# Patient Record
Sex: Female | Born: 1965 | Race: White | Hispanic: No | Marital: Married | Smoking: Former smoker
Health system: Southern US, Community
[De-identification: ages and names within clinical notes are randomized; demographics above are authoritative.]

## PROBLEM LIST (undated history)

## (undated) DIAGNOSIS — G500 Trigeminal neuralgia: Secondary | ICD-10-CM

## (undated) DIAGNOSIS — I471 Supraventricular tachycardia, unspecified: Secondary | ICD-10-CM

## (undated) DIAGNOSIS — G2581 Restless legs syndrome: Secondary | ICD-10-CM

## (undated) DIAGNOSIS — G44099 Other trigeminal autonomic cephalgias (TAC), not intractable: Secondary | ICD-10-CM

## (undated) DIAGNOSIS — G4733 Obstructive sleep apnea (adult) (pediatric): Secondary | ICD-10-CM

## (undated) HISTORY — DX: Other trigeminal autonomic cephalgias (tac), not intractable: G44.099

## (undated) HISTORY — DX: Trigeminal neuralgia: G50.0

## (undated) HISTORY — DX: Obstructive sleep apnea (adult) (pediatric): G47.33

## (undated) HISTORY — DX: Supraventricular tachycardia, unspecified: I47.10

## (undated) HISTORY — DX: Supraventricular tachycardia: I47.1

## (undated) HISTORY — PX: BRONCHOSCOPY: SUR163

## (undated) HISTORY — PX: SALIVARY STONE REMOVAL: SHX5213

## (undated) HISTORY — PX: BACK SURGERY: SHX140

## (undated) HISTORY — DX: Restless legs syndrome: G25.81

## (undated) HISTORY — PX: TOTAL ABDOMINAL HYSTERECTOMY: SHX209

## (undated) HISTORY — PX: COLONOSCOPY: SHX174

## (undated) HISTORY — PX: TONSILLECTOMY: SUR1361

## (undated) HISTORY — PX: OTHER SURGICAL HISTORY: SHX169

---

## 2004-08-27 ENCOUNTER — Encounter: Admission: RE | Admit: 2004-08-27 | Discharge: 2004-08-27 | Payer: Self-pay | Admitting: Vascular Surgery

## 2004-09-05 ENCOUNTER — Encounter (INDEPENDENT_AMBULATORY_CARE_PROVIDER_SITE_OTHER): Payer: Self-pay | Admitting: Specialist

## 2004-09-05 ENCOUNTER — Encounter: Admission: RE | Admit: 2004-09-05 | Discharge: 2004-09-05 | Payer: Self-pay | Admitting: Vascular Surgery

## 2006-12-02 ENCOUNTER — Encounter: Admission: RE | Admit: 2006-12-02 | Discharge: 2006-12-02 | Payer: Self-pay | Admitting: Family Medicine

## 2014-07-14 DIAGNOSIS — M1990 Unspecified osteoarthritis, unspecified site: Secondary | ICD-10-CM

## 2014-07-14 HISTORY — DX: Unspecified osteoarthritis, unspecified site: M19.90

## 2015-04-23 DIAGNOSIS — J209 Acute bronchitis, unspecified: Secondary | ICD-10-CM | POA: Diagnosis not present

## 2015-06-05 DIAGNOSIS — G2581 Restless legs syndrome: Secondary | ICD-10-CM | POA: Diagnosis not present

## 2015-06-05 DIAGNOSIS — R7301 Impaired fasting glucose: Secondary | ICD-10-CM | POA: Diagnosis not present

## 2015-06-05 DIAGNOSIS — E782 Mixed hyperlipidemia: Secondary | ICD-10-CM | POA: Diagnosis not present

## 2015-06-05 DIAGNOSIS — M1990 Unspecified osteoarthritis, unspecified site: Secondary | ICD-10-CM | POA: Diagnosis not present

## 2015-06-05 DIAGNOSIS — I7300 Raynaud's syndrome without gangrene: Secondary | ICD-10-CM | POA: Diagnosis not present

## 2015-06-05 DIAGNOSIS — Z79899 Other long term (current) drug therapy: Secondary | ICD-10-CM | POA: Diagnosis not present

## 2015-06-25 DIAGNOSIS — G2581 Restless legs syndrome: Secondary | ICD-10-CM | POA: Diagnosis not present

## 2015-06-25 DIAGNOSIS — I7300 Raynaud's syndrome without gangrene: Secondary | ICD-10-CM | POA: Diagnosis not present

## 2015-06-25 DIAGNOSIS — E782 Mixed hyperlipidemia: Secondary | ICD-10-CM | POA: Diagnosis not present

## 2015-10-09 DIAGNOSIS — N3941 Urge incontinence: Secondary | ICD-10-CM | POA: Diagnosis not present

## 2015-10-09 DIAGNOSIS — J4540 Moderate persistent asthma, uncomplicated: Secondary | ICD-10-CM | POA: Diagnosis not present

## 2015-10-09 DIAGNOSIS — E782 Mixed hyperlipidemia: Secondary | ICD-10-CM | POA: Diagnosis not present

## 2015-10-09 DIAGNOSIS — I10 Essential (primary) hypertension: Secondary | ICD-10-CM | POA: Diagnosis not present

## 2015-10-09 DIAGNOSIS — K219 Gastro-esophageal reflux disease without esophagitis: Secondary | ICD-10-CM | POA: Diagnosis not present

## 2015-10-09 DIAGNOSIS — M3219 Other organ or system involvement in systemic lupus erythematosus: Secondary | ICD-10-CM | POA: Diagnosis not present

## 2015-10-17 DIAGNOSIS — J449 Chronic obstructive pulmonary disease, unspecified: Secondary | ICD-10-CM | POA: Diagnosis not present

## 2015-10-17 DIAGNOSIS — G4750 Parasomnia, unspecified: Secondary | ICD-10-CM | POA: Diagnosis not present

## 2015-10-17 DIAGNOSIS — G4710 Hypersomnia, unspecified: Secondary | ICD-10-CM | POA: Diagnosis not present

## 2015-10-17 DIAGNOSIS — G2581 Restless legs syndrome: Secondary | ICD-10-CM | POA: Diagnosis not present

## 2015-11-09 DIAGNOSIS — G4720 Circadian rhythm sleep disorder, unspecified type: Secondary | ICD-10-CM | POA: Diagnosis not present

## 2015-11-09 DIAGNOSIS — G4730 Sleep apnea, unspecified: Secondary | ICD-10-CM | POA: Diagnosis not present

## 2015-11-16 DIAGNOSIS — J449 Chronic obstructive pulmonary disease, unspecified: Secondary | ICD-10-CM | POA: Diagnosis not present

## 2015-11-16 DIAGNOSIS — R0683 Snoring: Secondary | ICD-10-CM | POA: Diagnosis not present

## 2015-11-16 DIAGNOSIS — I10 Essential (primary) hypertension: Secondary | ICD-10-CM | POA: Diagnosis not present

## 2015-11-16 DIAGNOSIS — L930 Discoid lupus erythematosus: Secondary | ICD-10-CM | POA: Diagnosis not present

## 2015-11-20 DIAGNOSIS — I10 Essential (primary) hypertension: Secondary | ICD-10-CM | POA: Diagnosis not present

## 2015-11-20 DIAGNOSIS — M545 Low back pain: Secondary | ICD-10-CM | POA: Diagnosis not present

## 2015-11-20 DIAGNOSIS — G2581 Restless legs syndrome: Secondary | ICD-10-CM | POA: Diagnosis not present

## 2015-11-20 DIAGNOSIS — E6609 Other obesity due to excess calories: Secondary | ICD-10-CM | POA: Diagnosis not present

## 2015-11-20 DIAGNOSIS — M5136 Other intervertebral disc degeneration, lumbar region: Secondary | ICD-10-CM | POA: Diagnosis not present

## 2015-11-21 DIAGNOSIS — J4540 Moderate persistent asthma, uncomplicated: Secondary | ICD-10-CM | POA: Diagnosis not present

## 2015-11-21 DIAGNOSIS — K219 Gastro-esophageal reflux disease without esophagitis: Secondary | ICD-10-CM | POA: Diagnosis not present

## 2015-11-21 DIAGNOSIS — I10 Essential (primary) hypertension: Secondary | ICD-10-CM | POA: Diagnosis not present

## 2015-11-21 DIAGNOSIS — M3219 Other organ or system involvement in systemic lupus erythematosus: Secondary | ICD-10-CM | POA: Diagnosis not present

## 2015-11-21 DIAGNOSIS — N3941 Urge incontinence: Secondary | ICD-10-CM | POA: Diagnosis not present

## 2015-12-10 DIAGNOSIS — M25511 Pain in right shoulder: Secondary | ICD-10-CM | POA: Diagnosis not present

## 2015-12-10 DIAGNOSIS — Z79899 Other long term (current) drug therapy: Secondary | ICD-10-CM | POA: Diagnosis not present

## 2015-12-10 DIAGNOSIS — R3129 Other microscopic hematuria: Secondary | ICD-10-CM | POA: Diagnosis not present

## 2015-12-10 DIAGNOSIS — I10 Essential (primary) hypertension: Secondary | ICD-10-CM | POA: Diagnosis not present

## 2015-12-10 DIAGNOSIS — M3219 Other organ or system involvement in systemic lupus erythematosus: Secondary | ICD-10-CM | POA: Diagnosis not present

## 2015-12-11 DIAGNOSIS — M5136 Other intervertebral disc degeneration, lumbar region: Secondary | ICD-10-CM | POA: Diagnosis not present

## 2015-12-11 DIAGNOSIS — Z90710 Acquired absence of both cervix and uterus: Secondary | ICD-10-CM | POA: Diagnosis not present

## 2015-12-11 DIAGNOSIS — N200 Calculus of kidney: Secondary | ICD-10-CM | POA: Diagnosis not present

## 2015-12-14 DIAGNOSIS — N200 Calculus of kidney: Secondary | ICD-10-CM | POA: Diagnosis not present

## 2015-12-14 DIAGNOSIS — R3129 Other microscopic hematuria: Secondary | ICD-10-CM | POA: Diagnosis not present

## 2015-12-14 DIAGNOSIS — R1032 Left lower quadrant pain: Secondary | ICD-10-CM | POA: Diagnosis not present

## 2015-12-17 DIAGNOSIS — N200 Calculus of kidney: Secondary | ICD-10-CM | POA: Diagnosis not present

## 2015-12-19 DIAGNOSIS — R3129 Other microscopic hematuria: Secondary | ICD-10-CM | POA: Diagnosis not present

## 2015-12-19 DIAGNOSIS — R109 Unspecified abdominal pain: Secondary | ICD-10-CM | POA: Diagnosis not present

## 2015-12-19 DIAGNOSIS — N200 Calculus of kidney: Secondary | ICD-10-CM | POA: Diagnosis not present

## 2015-12-21 DIAGNOSIS — M797 Fibromyalgia: Secondary | ICD-10-CM | POA: Diagnosis not present

## 2015-12-21 DIAGNOSIS — N200 Calculus of kidney: Secondary | ICD-10-CM | POA: Diagnosis not present

## 2015-12-21 DIAGNOSIS — N202 Calculus of kidney with calculus of ureter: Secondary | ICD-10-CM | POA: Diagnosis not present

## 2015-12-21 DIAGNOSIS — J449 Chronic obstructive pulmonary disease, unspecified: Secondary | ICD-10-CM | POA: Diagnosis not present

## 2015-12-21 DIAGNOSIS — M329 Systemic lupus erythematosus, unspecified: Secondary | ICD-10-CM | POA: Diagnosis not present

## 2016-01-01 DIAGNOSIS — N200 Calculus of kidney: Secondary | ICD-10-CM | POA: Diagnosis not present

## 2016-01-01 DIAGNOSIS — R109 Unspecified abdominal pain: Secondary | ICD-10-CM | POA: Diagnosis not present

## 2016-01-08 DIAGNOSIS — N200 Calculus of kidney: Secondary | ICD-10-CM | POA: Diagnosis not present

## 2016-01-16 DIAGNOSIS — G4733 Obstructive sleep apnea (adult) (pediatric): Secondary | ICD-10-CM | POA: Diagnosis not present

## 2016-01-16 DIAGNOSIS — I10 Essential (primary) hypertension: Secondary | ICD-10-CM | POA: Diagnosis not present

## 2016-01-16 DIAGNOSIS — M5431 Sciatica, right side: Secondary | ICD-10-CM | POA: Diagnosis not present

## 2016-01-16 DIAGNOSIS — E782 Mixed hyperlipidemia: Secondary | ICD-10-CM | POA: Diagnosis not present

## 2016-01-16 DIAGNOSIS — N3941 Urge incontinence: Secondary | ICD-10-CM | POA: Diagnosis not present

## 2016-01-16 DIAGNOSIS — M545 Low back pain: Secondary | ICD-10-CM | POA: Diagnosis not present

## 2016-01-16 DIAGNOSIS — J4540 Moderate persistent asthma, uncomplicated: Secondary | ICD-10-CM | POA: Diagnosis not present

## 2016-01-16 DIAGNOSIS — M3219 Other organ or system involvement in systemic lupus erythematosus: Secondary | ICD-10-CM | POA: Diagnosis not present

## 2016-02-14 DIAGNOSIS — N200 Calculus of kidney: Secondary | ICD-10-CM | POA: Diagnosis not present

## 2016-02-14 DIAGNOSIS — R109 Unspecified abdominal pain: Secondary | ICD-10-CM | POA: Diagnosis not present

## 2016-03-09 DIAGNOSIS — S8392XA Sprain of unspecified site of left knee, initial encounter: Secondary | ICD-10-CM | POA: Diagnosis not present

## 2016-04-09 DIAGNOSIS — M25562 Pain in left knee: Secondary | ICD-10-CM | POA: Diagnosis not present

## 2016-04-09 DIAGNOSIS — M545 Low back pain: Secondary | ICD-10-CM | POA: Diagnosis not present

## 2016-04-14 DIAGNOSIS — R0789 Other chest pain: Secondary | ICD-10-CM | POA: Diagnosis not present

## 2016-04-14 DIAGNOSIS — R102 Pelvic and perineal pain: Secondary | ICD-10-CM | POA: Diagnosis not present

## 2016-04-14 DIAGNOSIS — R10814 Left lower quadrant abdominal tenderness: Secondary | ICD-10-CM | POA: Diagnosis not present

## 2016-04-14 DIAGNOSIS — R079 Chest pain, unspecified: Secondary | ICD-10-CM | POA: Diagnosis not present

## 2016-04-14 DIAGNOSIS — R072 Precordial pain: Secondary | ICD-10-CM | POA: Diagnosis not present

## 2016-04-14 DIAGNOSIS — R1084 Generalized abdominal pain: Secondary | ICD-10-CM | POA: Diagnosis not present

## 2016-04-14 DIAGNOSIS — I10 Essential (primary) hypertension: Secondary | ICD-10-CM | POA: Diagnosis not present

## 2016-04-14 DIAGNOSIS — R0602 Shortness of breath: Secondary | ICD-10-CM | POA: Diagnosis not present

## 2016-04-15 DIAGNOSIS — R079 Chest pain, unspecified: Secondary | ICD-10-CM | POA: Diagnosis not present

## 2016-04-16 DIAGNOSIS — M47896 Other spondylosis, lumbar region: Secondary | ICD-10-CM | POA: Diagnosis not present

## 2016-04-16 DIAGNOSIS — M549 Dorsalgia, unspecified: Secondary | ICD-10-CM | POA: Diagnosis not present

## 2016-04-16 DIAGNOSIS — M545 Low back pain: Secondary | ICD-10-CM | POA: Diagnosis not present

## 2016-04-16 DIAGNOSIS — M5416 Radiculopathy, lumbar region: Secondary | ICD-10-CM | POA: Diagnosis not present

## 2016-04-16 DIAGNOSIS — M5136 Other intervertebral disc degeneration, lumbar region: Secondary | ICD-10-CM | POA: Diagnosis not present

## 2016-04-21 DIAGNOSIS — M545 Low back pain: Secondary | ICD-10-CM | POA: Diagnosis not present

## 2016-04-21 DIAGNOSIS — M5136 Other intervertebral disc degeneration, lumbar region: Secondary | ICD-10-CM | POA: Diagnosis not present

## 2016-04-28 DIAGNOSIS — R5383 Other fatigue: Secondary | ICD-10-CM | POA: Diagnosis not present

## 2016-05-05 DIAGNOSIS — E041 Nontoxic single thyroid nodule: Secondary | ICD-10-CM | POA: Diagnosis not present

## 2016-05-14 DIAGNOSIS — M47896 Other spondylosis, lumbar region: Secondary | ICD-10-CM | POA: Diagnosis not present

## 2016-05-14 DIAGNOSIS — M5416 Radiculopathy, lumbar region: Secondary | ICD-10-CM | POA: Diagnosis not present

## 2016-05-14 DIAGNOSIS — M5136 Other intervertebral disc degeneration, lumbar region: Secondary | ICD-10-CM | POA: Diagnosis not present

## 2016-05-21 DIAGNOSIS — M545 Low back pain: Secondary | ICD-10-CM | POA: Diagnosis not present

## 2016-05-21 DIAGNOSIS — I10 Essential (primary) hypertension: Secondary | ICD-10-CM | POA: Diagnosis not present

## 2016-05-21 DIAGNOSIS — B370 Candidal stomatitis: Secondary | ICD-10-CM | POA: Diagnosis not present

## 2016-06-03 DIAGNOSIS — N200 Calculus of kidney: Secondary | ICD-10-CM | POA: Diagnosis not present

## 2016-06-03 DIAGNOSIS — M549 Dorsalgia, unspecified: Secondary | ICD-10-CM | POA: Diagnosis not present

## 2016-06-04 DIAGNOSIS — M545 Low back pain: Secondary | ICD-10-CM | POA: Diagnosis not present

## 2016-06-04 DIAGNOSIS — Z79899 Other long term (current) drug therapy: Secondary | ICD-10-CM | POA: Diagnosis not present

## 2016-06-04 DIAGNOSIS — G894 Chronic pain syndrome: Secondary | ICD-10-CM | POA: Diagnosis not present

## 2016-07-07 DIAGNOSIS — G2581 Restless legs syndrome: Secondary | ICD-10-CM | POA: Diagnosis not present

## 2016-07-07 DIAGNOSIS — E782 Mixed hyperlipidemia: Secondary | ICD-10-CM | POA: Diagnosis not present

## 2016-07-07 DIAGNOSIS — R109 Unspecified abdominal pain: Secondary | ICD-10-CM | POA: Diagnosis not present

## 2016-07-07 DIAGNOSIS — I10 Essential (primary) hypertension: Secondary | ICD-10-CM | POA: Diagnosis not present

## 2016-07-07 DIAGNOSIS — N200 Calculus of kidney: Secondary | ICD-10-CM | POA: Diagnosis not present

## 2016-07-07 DIAGNOSIS — R7301 Impaired fasting glucose: Secondary | ICD-10-CM | POA: Diagnosis not present

## 2016-07-07 DIAGNOSIS — M545 Low back pain: Secondary | ICD-10-CM | POA: Diagnosis not present

## 2016-08-05 DIAGNOSIS — M47896 Other spondylosis, lumbar region: Secondary | ICD-10-CM | POA: Diagnosis not present

## 2016-08-05 DIAGNOSIS — G894 Chronic pain syndrome: Secondary | ICD-10-CM | POA: Diagnosis not present

## 2016-08-05 DIAGNOSIS — M545 Low back pain: Secondary | ICD-10-CM | POA: Diagnosis not present

## 2016-08-05 DIAGNOSIS — M5136 Other intervertebral disc degeneration, lumbar region: Secondary | ICD-10-CM | POA: Diagnosis not present

## 2016-09-02 DIAGNOSIS — R112 Nausea with vomiting, unspecified: Secondary | ICD-10-CM | POA: Diagnosis not present

## 2016-09-02 DIAGNOSIS — R140 Abdominal distension (gaseous): Secondary | ICD-10-CM | POA: Diagnosis not present

## 2016-09-02 DIAGNOSIS — R1013 Epigastric pain: Secondary | ICD-10-CM | POA: Diagnosis not present

## 2016-09-04 DIAGNOSIS — M47896 Other spondylosis, lumbar region: Secondary | ICD-10-CM | POA: Diagnosis not present

## 2016-09-04 DIAGNOSIS — M545 Low back pain: Secondary | ICD-10-CM | POA: Diagnosis not present

## 2016-09-04 DIAGNOSIS — M5136 Other intervertebral disc degeneration, lumbar region: Secondary | ICD-10-CM | POA: Diagnosis not present

## 2016-09-04 DIAGNOSIS — M5416 Radiculopathy, lumbar region: Secondary | ICD-10-CM | POA: Diagnosis not present

## 2016-09-10 DIAGNOSIS — K2970 Gastritis, unspecified, without bleeding: Secondary | ICD-10-CM | POA: Diagnosis not present

## 2016-09-10 DIAGNOSIS — E663 Overweight: Secondary | ICD-10-CM | POA: Diagnosis not present

## 2016-09-10 DIAGNOSIS — E785 Hyperlipidemia, unspecified: Secondary | ICD-10-CM | POA: Diagnosis not present

## 2016-09-10 DIAGNOSIS — K219 Gastro-esophageal reflux disease without esophagitis: Secondary | ICD-10-CM | POA: Diagnosis not present

## 2016-09-10 DIAGNOSIS — I7300 Raynaud's syndrome without gangrene: Secondary | ICD-10-CM | POA: Diagnosis not present

## 2016-09-10 DIAGNOSIS — G4733 Obstructive sleep apnea (adult) (pediatric): Secondary | ICD-10-CM | POA: Diagnosis not present

## 2016-09-10 DIAGNOSIS — M1990 Unspecified osteoarthritis, unspecified site: Secondary | ICD-10-CM | POA: Diagnosis not present

## 2016-09-10 DIAGNOSIS — Z6830 Body mass index (BMI) 30.0-30.9, adult: Secondary | ICD-10-CM | POA: Diagnosis not present

## 2016-09-10 DIAGNOSIS — J449 Chronic obstructive pulmonary disease, unspecified: Secondary | ICD-10-CM | POA: Diagnosis not present

## 2016-09-10 DIAGNOSIS — M797 Fibromyalgia: Secondary | ICD-10-CM | POA: Diagnosis not present

## 2016-09-10 DIAGNOSIS — K449 Diaphragmatic hernia without obstruction or gangrene: Secondary | ICD-10-CM | POA: Diagnosis not present

## 2016-09-10 DIAGNOSIS — R140 Abdominal distension (gaseous): Secondary | ICD-10-CM | POA: Diagnosis not present

## 2016-09-10 DIAGNOSIS — R109 Unspecified abdominal pain: Secondary | ICD-10-CM | POA: Diagnosis not present

## 2016-09-10 DIAGNOSIS — R112 Nausea with vomiting, unspecified: Secondary | ICD-10-CM | POA: Diagnosis not present

## 2016-09-10 DIAGNOSIS — H906 Mixed conductive and sensorineural hearing loss, bilateral: Secondary | ICD-10-CM | POA: Diagnosis not present

## 2016-09-10 DIAGNOSIS — E669 Obesity, unspecified: Secondary | ICD-10-CM | POA: Diagnosis not present

## 2016-09-10 DIAGNOSIS — K2900 Acute gastritis without bleeding: Secondary | ICD-10-CM | POA: Diagnosis not present

## 2016-09-10 DIAGNOSIS — M3500 Sicca syndrome, unspecified: Secondary | ICD-10-CM | POA: Diagnosis not present

## 2016-09-10 DIAGNOSIS — R1013 Epigastric pain: Secondary | ICD-10-CM | POA: Diagnosis not present

## 2016-09-10 DIAGNOSIS — G2581 Restless legs syndrome: Secondary | ICD-10-CM | POA: Diagnosis not present

## 2016-09-10 DIAGNOSIS — I10 Essential (primary) hypertension: Secondary | ICD-10-CM | POA: Diagnosis not present

## 2016-09-10 DIAGNOSIS — Z0001 Encounter for general adult medical examination with abnormal findings: Secondary | ICD-10-CM | POA: Diagnosis not present

## 2016-09-15 DIAGNOSIS — R1013 Epigastric pain: Secondary | ICD-10-CM | POA: Diagnosis not present

## 2016-09-15 DIAGNOSIS — R112 Nausea with vomiting, unspecified: Secondary | ICD-10-CM | POA: Diagnosis not present

## 2016-09-15 DIAGNOSIS — R140 Abdominal distension (gaseous): Secondary | ICD-10-CM | POA: Diagnosis not present

## 2016-09-18 DIAGNOSIS — Z1231 Encounter for screening mammogram for malignant neoplasm of breast: Secondary | ICD-10-CM | POA: Diagnosis not present

## 2016-10-06 DIAGNOSIS — M47896 Other spondylosis, lumbar region: Secondary | ICD-10-CM | POA: Diagnosis not present

## 2016-10-06 DIAGNOSIS — M5136 Other intervertebral disc degeneration, lumbar region: Secondary | ICD-10-CM | POA: Diagnosis not present

## 2016-10-06 DIAGNOSIS — M545 Low back pain: Secondary | ICD-10-CM | POA: Diagnosis not present

## 2016-10-13 DIAGNOSIS — E782 Mixed hyperlipidemia: Secondary | ICD-10-CM | POA: Diagnosis not present

## 2016-10-13 DIAGNOSIS — Z23 Encounter for immunization: Secondary | ICD-10-CM | POA: Diagnosis not present

## 2016-10-13 DIAGNOSIS — R7301 Impaired fasting glucose: Secondary | ICD-10-CM | POA: Diagnosis not present

## 2016-10-13 DIAGNOSIS — G2581 Restless legs syndrome: Secondary | ICD-10-CM | POA: Diagnosis not present

## 2016-10-13 DIAGNOSIS — M545 Low back pain: Secondary | ICD-10-CM | POA: Diagnosis not present

## 2016-10-13 DIAGNOSIS — I10 Essential (primary) hypertension: Secondary | ICD-10-CM | POA: Diagnosis not present

## 2016-10-13 DIAGNOSIS — R202 Paresthesia of skin: Secondary | ICD-10-CM | POA: Diagnosis not present

## 2016-11-04 DIAGNOSIS — M5136 Other intervertebral disc degeneration, lumbar region: Secondary | ICD-10-CM | POA: Diagnosis not present

## 2016-11-04 DIAGNOSIS — M545 Low back pain: Secondary | ICD-10-CM | POA: Diagnosis not present

## 2016-11-04 DIAGNOSIS — G894 Chronic pain syndrome: Secondary | ICD-10-CM | POA: Diagnosis not present

## 2016-11-04 DIAGNOSIS — M47896 Other spondylosis, lumbar region: Secondary | ICD-10-CM | POA: Diagnosis not present

## 2016-11-24 DIAGNOSIS — M329 Systemic lupus erythematosus, unspecified: Secondary | ICD-10-CM | POA: Diagnosis not present

## 2016-11-24 DIAGNOSIS — K649 Unspecified hemorrhoids: Secondary | ICD-10-CM | POA: Diagnosis not present

## 2016-11-24 DIAGNOSIS — F17210 Nicotine dependence, cigarettes, uncomplicated: Secondary | ICD-10-CM | POA: Diagnosis not present

## 2016-11-24 DIAGNOSIS — Z79899 Other long term (current) drug therapy: Secondary | ICD-10-CM | POA: Diagnosis not present

## 2016-11-24 DIAGNOSIS — D128 Benign neoplasm of rectum: Secondary | ICD-10-CM | POA: Diagnosis not present

## 2016-11-24 DIAGNOSIS — I10 Essential (primary) hypertension: Secondary | ICD-10-CM | POA: Diagnosis not present

## 2016-11-24 DIAGNOSIS — K219 Gastro-esophageal reflux disease without esophagitis: Secondary | ICD-10-CM | POA: Diagnosis not present

## 2016-11-24 DIAGNOSIS — J449 Chronic obstructive pulmonary disease, unspecified: Secondary | ICD-10-CM | POA: Diagnosis not present

## 2016-11-24 DIAGNOSIS — E785 Hyperlipidemia, unspecified: Secondary | ICD-10-CM | POA: Diagnosis not present

## 2016-11-24 DIAGNOSIS — G43909 Migraine, unspecified, not intractable, without status migrainosus: Secondary | ICD-10-CM | POA: Diagnosis not present

## 2016-11-24 DIAGNOSIS — F329 Major depressive disorder, single episode, unspecified: Secondary | ICD-10-CM | POA: Diagnosis not present

## 2016-11-24 DIAGNOSIS — K648 Other hemorrhoids: Secondary | ICD-10-CM | POA: Diagnosis not present

## 2016-11-24 DIAGNOSIS — Z1211 Encounter for screening for malignant neoplasm of colon: Secondary | ICD-10-CM | POA: Diagnosis not present

## 2016-11-24 DIAGNOSIS — G4730 Sleep apnea, unspecified: Secondary | ICD-10-CM | POA: Diagnosis not present

## 2016-11-24 DIAGNOSIS — F419 Anxiety disorder, unspecified: Secondary | ICD-10-CM | POA: Diagnosis not present

## 2016-11-24 DIAGNOSIS — K5730 Diverticulosis of large intestine without perforation or abscess without bleeding: Secondary | ICD-10-CM | POA: Diagnosis not present

## 2016-12-02 DIAGNOSIS — M5136 Other intervertebral disc degeneration, lumbar region: Secondary | ICD-10-CM | POA: Diagnosis not present

## 2016-12-02 DIAGNOSIS — Z79899 Other long term (current) drug therapy: Secondary | ICD-10-CM | POA: Diagnosis not present

## 2016-12-02 DIAGNOSIS — M545 Low back pain: Secondary | ICD-10-CM | POA: Diagnosis not present

## 2016-12-02 DIAGNOSIS — R1032 Left lower quadrant pain: Secondary | ICD-10-CM | POA: Diagnosis not present

## 2016-12-02 DIAGNOSIS — N200 Calculus of kidney: Secondary | ICD-10-CM | POA: Diagnosis not present

## 2016-12-02 DIAGNOSIS — M47896 Other spondylosis, lumbar region: Secondary | ICD-10-CM | POA: Diagnosis not present

## 2016-12-02 DIAGNOSIS — G894 Chronic pain syndrome: Secondary | ICD-10-CM | POA: Diagnosis not present

## 2016-12-25 DIAGNOSIS — J441 Chronic obstructive pulmonary disease with (acute) exacerbation: Secondary | ICD-10-CM | POA: Diagnosis not present

## 2016-12-31 DIAGNOSIS — G894 Chronic pain syndrome: Secondary | ICD-10-CM | POA: Diagnosis not present

## 2016-12-31 DIAGNOSIS — M5136 Other intervertebral disc degeneration, lumbar region: Secondary | ICD-10-CM | POA: Diagnosis not present

## 2016-12-31 DIAGNOSIS — M47896 Other spondylosis, lumbar region: Secondary | ICD-10-CM | POA: Diagnosis not present

## 2016-12-31 DIAGNOSIS — M5416 Radiculopathy, lumbar region: Secondary | ICD-10-CM | POA: Diagnosis not present

## 2017-01-19 DIAGNOSIS — M3500 Sicca syndrome, unspecified: Secondary | ICD-10-CM

## 2017-01-19 HISTORY — DX: Sjogren syndrome, unspecified: M35.00

## 2017-01-22 DIAGNOSIS — M3500 Sicca syndrome, unspecified: Secondary | ICD-10-CM | POA: Diagnosis not present

## 2017-01-22 DIAGNOSIS — Z7689 Persons encountering health services in other specified circumstances: Secondary | ICD-10-CM | POA: Diagnosis not present

## 2017-01-22 DIAGNOSIS — E782 Mixed hyperlipidemia: Secondary | ICD-10-CM | POA: Diagnosis not present

## 2017-01-22 DIAGNOSIS — Z01818 Encounter for other preprocedural examination: Secondary | ICD-10-CM | POA: Diagnosis not present

## 2017-01-23 DIAGNOSIS — Z01810 Encounter for preprocedural cardiovascular examination: Secondary | ICD-10-CM | POA: Diagnosis not present

## 2017-01-23 DIAGNOSIS — I10 Essential (primary) hypertension: Secondary | ICD-10-CM | POA: Diagnosis not present

## 2017-01-23 DIAGNOSIS — N3000 Acute cystitis without hematuria: Secondary | ICD-10-CM | POA: Diagnosis not present

## 2017-01-23 DIAGNOSIS — E782 Mixed hyperlipidemia: Secondary | ICD-10-CM | POA: Diagnosis not present

## 2017-01-23 DIAGNOSIS — J4540 Moderate persistent asthma, uncomplicated: Secondary | ICD-10-CM | POA: Diagnosis not present

## 2017-01-25 DIAGNOSIS — M3500 Sicca syndrome, unspecified: Secondary | ICD-10-CM | POA: Diagnosis not present

## 2017-01-25 DIAGNOSIS — Z01818 Encounter for other preprocedural examination: Secondary | ICD-10-CM | POA: Diagnosis not present

## 2017-01-27 DIAGNOSIS — Z7689 Persons encountering health services in other specified circumstances: Secondary | ICD-10-CM | POA: Diagnosis not present

## 2017-01-27 DIAGNOSIS — Z01818 Encounter for other preprocedural examination: Secondary | ICD-10-CM | POA: Diagnosis not present

## 2017-01-27 DIAGNOSIS — E782 Mixed hyperlipidemia: Secondary | ICD-10-CM | POA: Diagnosis not present

## 2017-01-27 DIAGNOSIS — M3500 Sicca syndrome, unspecified: Secondary | ICD-10-CM | POA: Diagnosis not present

## 2017-01-27 DIAGNOSIS — R079 Chest pain, unspecified: Secondary | ICD-10-CM | POA: Diagnosis not present

## 2017-01-27 DIAGNOSIS — Z01810 Encounter for preprocedural cardiovascular examination: Secondary | ICD-10-CM | POA: Diagnosis not present

## 2017-01-30 DIAGNOSIS — M5136 Other intervertebral disc degeneration, lumbar region: Secondary | ICD-10-CM | POA: Diagnosis not present

## 2017-01-30 DIAGNOSIS — M47896 Other spondylosis, lumbar region: Secondary | ICD-10-CM | POA: Diagnosis not present

## 2017-01-30 DIAGNOSIS — M545 Low back pain: Secondary | ICD-10-CM | POA: Diagnosis not present

## 2017-01-30 DIAGNOSIS — M5106 Intervertebral disc disorders with myelopathy, lumbar region: Secondary | ICD-10-CM | POA: Diagnosis not present

## 2017-01-30 DIAGNOSIS — Z4689 Encounter for fitting and adjustment of other specified devices: Secondary | ICD-10-CM | POA: Diagnosis not present

## 2017-02-02 DIAGNOSIS — N200 Calculus of kidney: Secondary | ICD-10-CM | POA: Diagnosis not present

## 2017-02-02 DIAGNOSIS — Z01818 Encounter for other preprocedural examination: Secondary | ICD-10-CM | POA: Diagnosis not present

## 2017-02-02 DIAGNOSIS — Z01810 Encounter for preprocedural cardiovascular examination: Secondary | ICD-10-CM | POA: Diagnosis not present

## 2017-02-09 DIAGNOSIS — M545 Low back pain: Secondary | ICD-10-CM | POA: Diagnosis not present

## 2017-02-09 DIAGNOSIS — R7301 Impaired fasting glucose: Secondary | ICD-10-CM | POA: Diagnosis not present

## 2017-02-09 DIAGNOSIS — G2581 Restless legs syndrome: Secondary | ICD-10-CM | POA: Diagnosis not present

## 2017-02-10 DIAGNOSIS — M545 Low back pain: Secondary | ICD-10-CM | POA: Diagnosis not present

## 2017-02-10 DIAGNOSIS — M5106 Intervertebral disc disorders with myelopathy, lumbar region: Secondary | ICD-10-CM | POA: Diagnosis not present

## 2017-02-10 DIAGNOSIS — M5136 Other intervertebral disc degeneration, lumbar region: Secondary | ICD-10-CM | POA: Diagnosis not present

## 2017-02-10 DIAGNOSIS — Z01818 Encounter for other preprocedural examination: Secondary | ICD-10-CM | POA: Diagnosis not present

## 2017-02-17 DIAGNOSIS — M4316 Spondylolisthesis, lumbar region: Secondary | ICD-10-CM

## 2017-02-17 DIAGNOSIS — E785 Hyperlipidemia, unspecified: Secondary | ICD-10-CM | POA: Diagnosis not present

## 2017-02-17 DIAGNOSIS — M5137 Other intervertebral disc degeneration, lumbosacral region: Secondary | ICD-10-CM | POA: Diagnosis not present

## 2017-02-17 DIAGNOSIS — R6889 Other general symptoms and signs: Secondary | ICD-10-CM | POA: Diagnosis not present

## 2017-02-17 DIAGNOSIS — M3500 Sicca syndrome, unspecified: Secondary | ICD-10-CM | POA: Diagnosis not present

## 2017-02-17 DIAGNOSIS — M4326 Fusion of spine, lumbar region: Secondary | ICD-10-CM | POA: Diagnosis not present

## 2017-02-17 DIAGNOSIS — M329 Systemic lupus erythematosus, unspecified: Secondary | ICD-10-CM | POA: Diagnosis not present

## 2017-02-17 DIAGNOSIS — M5106 Intervertebral disc disorders with myelopathy, lumbar region: Secondary | ICD-10-CM | POA: Diagnosis not present

## 2017-02-17 DIAGNOSIS — M4807 Spinal stenosis, lumbosacral region: Secondary | ICD-10-CM | POA: Diagnosis not present

## 2017-02-17 DIAGNOSIS — M48061 Spinal stenosis, lumbar region without neurogenic claudication: Secondary | ICD-10-CM | POA: Diagnosis not present

## 2017-02-17 DIAGNOSIS — M545 Low back pain: Secondary | ICD-10-CM | POA: Diagnosis not present

## 2017-02-17 DIAGNOSIS — M4716 Other spondylosis with myelopathy, lumbar region: Secondary | ICD-10-CM | POA: Diagnosis not present

## 2017-02-17 DIAGNOSIS — M5117 Intervertebral disc disorders with radiculopathy, lumbosacral region: Secondary | ICD-10-CM | POA: Diagnosis not present

## 2017-02-17 DIAGNOSIS — M5136 Other intervertebral disc degeneration, lumbar region: Secondary | ICD-10-CM | POA: Diagnosis not present

## 2017-02-17 DIAGNOSIS — J449 Chronic obstructive pulmonary disease, unspecified: Secondary | ICD-10-CM | POA: Diagnosis not present

## 2017-02-17 DIAGNOSIS — G4733 Obstructive sleep apnea (adult) (pediatric): Secondary | ICD-10-CM | POA: Diagnosis not present

## 2017-02-17 DIAGNOSIS — F329 Major depressive disorder, single episode, unspecified: Secondary | ICD-10-CM | POA: Diagnosis not present

## 2017-02-17 DIAGNOSIS — M5416 Radiculopathy, lumbar region: Secondary | ICD-10-CM | POA: Diagnosis not present

## 2017-02-17 DIAGNOSIS — M5116 Intervertebral disc disorders with radiculopathy, lumbar region: Secondary | ICD-10-CM | POA: Diagnosis not present

## 2017-02-17 DIAGNOSIS — M797 Fibromyalgia: Secondary | ICD-10-CM | POA: Diagnosis not present

## 2017-02-17 HISTORY — DX: Spondylolisthesis, lumbar region: M43.16

## 2017-02-18 DIAGNOSIS — M4316 Spondylolisthesis, lumbar region: Secondary | ICD-10-CM | POA: Diagnosis not present

## 2017-02-18 DIAGNOSIS — M5106 Intervertebral disc disorders with myelopathy, lumbar region: Secondary | ICD-10-CM | POA: Diagnosis not present

## 2017-02-19 DIAGNOSIS — M4316 Spondylolisthesis, lumbar region: Secondary | ICD-10-CM | POA: Diagnosis not present

## 2017-02-19 DIAGNOSIS — M4326 Fusion of spine, lumbar region: Secondary | ICD-10-CM | POA: Diagnosis not present

## 2017-02-19 DIAGNOSIS — R6889 Other general symptoms and signs: Secondary | ICD-10-CM | POA: Diagnosis not present

## 2017-03-03 DIAGNOSIS — E782 Mixed hyperlipidemia: Secondary | ICD-10-CM | POA: Diagnosis not present

## 2017-03-03 DIAGNOSIS — Z87891 Personal history of nicotine dependence: Secondary | ICD-10-CM | POA: Diagnosis not present

## 2017-03-03 DIAGNOSIS — M3500 Sicca syndrome, unspecified: Secondary | ICD-10-CM | POA: Diagnosis not present

## 2017-03-03 DIAGNOSIS — R002 Palpitations: Secondary | ICD-10-CM | POA: Diagnosis not present

## 2017-03-03 DIAGNOSIS — Z01818 Encounter for other preprocedural examination: Secondary | ICD-10-CM | POA: Diagnosis not present

## 2017-03-03 HISTORY — DX: Palpitations: R00.2

## 2017-03-05 DIAGNOSIS — H9201 Otalgia, right ear: Secondary | ICD-10-CM | POA: Diagnosis not present

## 2017-03-05 DIAGNOSIS — J060 Acute laryngopharyngitis: Secondary | ICD-10-CM | POA: Diagnosis not present

## 2017-03-18 DIAGNOSIS — H8111 Benign paroxysmal vertigo, right ear: Secondary | ICD-10-CM | POA: Diagnosis not present

## 2017-03-18 DIAGNOSIS — H6121 Impacted cerumen, right ear: Secondary | ICD-10-CM | POA: Diagnosis not present

## 2017-03-18 DIAGNOSIS — H9201 Otalgia, right ear: Secondary | ICD-10-CM | POA: Diagnosis not present

## 2017-03-20 DIAGNOSIS — M545 Low back pain: Secondary | ICD-10-CM | POA: Diagnosis not present

## 2017-04-01 DIAGNOSIS — R1032 Left lower quadrant pain: Secondary | ICD-10-CM | POA: Diagnosis not present

## 2017-04-01 DIAGNOSIS — N200 Calculus of kidney: Secondary | ICD-10-CM | POA: Diagnosis not present

## 2017-05-12 DIAGNOSIS — R7301 Impaired fasting glucose: Secondary | ICD-10-CM | POA: Diagnosis not present

## 2017-05-12 DIAGNOSIS — E782 Mixed hyperlipidemia: Secondary | ICD-10-CM | POA: Diagnosis not present

## 2017-05-12 DIAGNOSIS — J4540 Moderate persistent asthma, uncomplicated: Secondary | ICD-10-CM | POA: Diagnosis not present

## 2017-05-12 DIAGNOSIS — I7300 Raynaud's syndrome without gangrene: Secondary | ICD-10-CM | POA: Diagnosis not present

## 2017-05-12 DIAGNOSIS — I10 Essential (primary) hypertension: Secondary | ICD-10-CM | POA: Diagnosis not present

## 2017-05-15 DIAGNOSIS — E042 Nontoxic multinodular goiter: Secondary | ICD-10-CM | POA: Diagnosis not present

## 2017-05-15 DIAGNOSIS — E041 Nontoxic single thyroid nodule: Secondary | ICD-10-CM | POA: Diagnosis not present

## 2017-05-20 DIAGNOSIS — M4326 Fusion of spine, lumbar region: Secondary | ICD-10-CM | POA: Diagnosis not present

## 2017-06-19 DIAGNOSIS — G894 Chronic pain syndrome: Secondary | ICD-10-CM | POA: Diagnosis not present

## 2017-06-19 DIAGNOSIS — Z6829 Body mass index (BMI) 29.0-29.9, adult: Secondary | ICD-10-CM | POA: Diagnosis not present

## 2017-06-19 DIAGNOSIS — M4326 Fusion of spine, lumbar region: Secondary | ICD-10-CM | POA: Diagnosis not present

## 2017-06-19 DIAGNOSIS — I10 Essential (primary) hypertension: Secondary | ICD-10-CM | POA: Diagnosis not present

## 2017-06-19 DIAGNOSIS — Z79899 Other long term (current) drug therapy: Secondary | ICD-10-CM | POA: Diagnosis not present

## 2017-07-20 DIAGNOSIS — I10 Essential (primary) hypertension: Secondary | ICD-10-CM | POA: Diagnosis not present

## 2017-07-20 DIAGNOSIS — G894 Chronic pain syndrome: Secondary | ICD-10-CM | POA: Diagnosis not present

## 2017-07-20 DIAGNOSIS — M4326 Fusion of spine, lumbar region: Secondary | ICD-10-CM | POA: Diagnosis not present

## 2017-08-18 DIAGNOSIS — M4326 Fusion of spine, lumbar region: Secondary | ICD-10-CM | POA: Diagnosis not present

## 2017-08-18 DIAGNOSIS — M47896 Other spondylosis, lumbar region: Secondary | ICD-10-CM | POA: Diagnosis not present

## 2017-08-18 DIAGNOSIS — M545 Low back pain: Secondary | ICD-10-CM | POA: Diagnosis not present

## 2017-08-28 DIAGNOSIS — J441 Chronic obstructive pulmonary disease with (acute) exacerbation: Secondary | ICD-10-CM | POA: Diagnosis not present

## 2017-08-28 DIAGNOSIS — R7301 Impaired fasting glucose: Secondary | ICD-10-CM | POA: Diagnosis not present

## 2017-08-28 DIAGNOSIS — E782 Mixed hyperlipidemia: Secondary | ICD-10-CM | POA: Diagnosis not present

## 2017-08-28 DIAGNOSIS — I10 Essential (primary) hypertension: Secondary | ICD-10-CM | POA: Diagnosis not present

## 2017-08-31 DIAGNOSIS — R0789 Other chest pain: Secondary | ICD-10-CM | POA: Diagnosis not present

## 2017-08-31 DIAGNOSIS — I110 Hypertensive heart disease with heart failure: Secondary | ICD-10-CM | POA: Diagnosis not present

## 2017-08-31 DIAGNOSIS — M3502 Sicca syndrome with lung involvement: Secondary | ICD-10-CM | POA: Diagnosis not present

## 2017-08-31 DIAGNOSIS — Z79899 Other long term (current) drug therapy: Secondary | ICD-10-CM | POA: Diagnosis not present

## 2017-08-31 DIAGNOSIS — G8929 Other chronic pain: Secondary | ICD-10-CM | POA: Diagnosis not present

## 2017-08-31 DIAGNOSIS — R740 Nonspecific elevation of levels of transaminase and lactic acid dehydrogenase [LDH]: Secondary | ICD-10-CM | POA: Diagnosis not present

## 2017-08-31 DIAGNOSIS — G894 Chronic pain syndrome: Secondary | ICD-10-CM | POA: Diagnosis not present

## 2017-08-31 DIAGNOSIS — J9601 Acute respiratory failure with hypoxia: Secondary | ICD-10-CM | POA: Diagnosis not present

## 2017-08-31 DIAGNOSIS — Z7982 Long term (current) use of aspirin: Secondary | ICD-10-CM | POA: Diagnosis not present

## 2017-08-31 DIAGNOSIS — M329 Systemic lupus erythematosus, unspecified: Secondary | ICD-10-CM | POA: Diagnosis not present

## 2017-08-31 DIAGNOSIS — I5033 Acute on chronic diastolic (congestive) heart failure: Secondary | ICD-10-CM | POA: Diagnosis not present

## 2017-08-31 DIAGNOSIS — R05 Cough: Secondary | ICD-10-CM | POA: Diagnosis not present

## 2017-08-31 DIAGNOSIS — R7989 Other specified abnormal findings of blood chemistry: Secondary | ICD-10-CM | POA: Diagnosis not present

## 2017-08-31 DIAGNOSIS — Z87891 Personal history of nicotine dependence: Secondary | ICD-10-CM | POA: Diagnosis not present

## 2017-08-31 DIAGNOSIS — J9690 Respiratory failure, unspecified, unspecified whether with hypoxia or hypercapnia: Secondary | ICD-10-CM | POA: Diagnosis not present

## 2017-08-31 DIAGNOSIS — J189 Pneumonia, unspecified organism: Secondary | ICD-10-CM | POA: Diagnosis not present

## 2017-08-31 DIAGNOSIS — R0902 Hypoxemia: Secondary | ICD-10-CM | POA: Diagnosis not present

## 2017-08-31 DIAGNOSIS — R0689 Other abnormalities of breathing: Secondary | ICD-10-CM | POA: Diagnosis not present

## 2017-08-31 DIAGNOSIS — J449 Chronic obstructive pulmonary disease, unspecified: Secondary | ICD-10-CM | POA: Diagnosis not present

## 2017-08-31 DIAGNOSIS — Z79891 Long term (current) use of opiate analgesic: Secondary | ICD-10-CM | POA: Diagnosis not present

## 2017-08-31 DIAGNOSIS — J441 Chronic obstructive pulmonary disease with (acute) exacerbation: Secondary | ICD-10-CM | POA: Diagnosis not present

## 2017-08-31 DIAGNOSIS — R0602 Shortness of breath: Secondary | ICD-10-CM | POA: Diagnosis not present

## 2017-08-31 DIAGNOSIS — E873 Alkalosis: Secondary | ICD-10-CM | POA: Diagnosis not present

## 2017-08-31 DIAGNOSIS — E785 Hyperlipidemia, unspecified: Secondary | ICD-10-CM | POA: Diagnosis not present

## 2017-08-31 DIAGNOSIS — G47419 Narcolepsy without cataplexy: Secondary | ICD-10-CM | POA: Diagnosis not present

## 2017-08-31 DIAGNOSIS — J209 Acute bronchitis, unspecified: Secondary | ICD-10-CM | POA: Diagnosis not present

## 2017-09-01 DIAGNOSIS — G8929 Other chronic pain: Secondary | ICD-10-CM | POA: Diagnosis not present

## 2017-09-01 DIAGNOSIS — R7989 Other specified abnormal findings of blood chemistry: Secondary | ICD-10-CM | POA: Diagnosis not present

## 2017-09-01 DIAGNOSIS — J449 Chronic obstructive pulmonary disease, unspecified: Secondary | ICD-10-CM | POA: Diagnosis not present

## 2017-09-01 DIAGNOSIS — R0602 Shortness of breath: Secondary | ICD-10-CM

## 2017-09-01 DIAGNOSIS — J189 Pneumonia, unspecified organism: Secondary | ICD-10-CM | POA: Diagnosis not present

## 2017-09-01 DIAGNOSIS — J9601 Acute respiratory failure with hypoxia: Secondary | ICD-10-CM | POA: Diagnosis not present

## 2017-09-02 DIAGNOSIS — J9601 Acute respiratory failure with hypoxia: Secondary | ICD-10-CM | POA: Diagnosis not present

## 2017-09-02 DIAGNOSIS — E873 Alkalosis: Secondary | ICD-10-CM

## 2017-09-02 DIAGNOSIS — R7989 Other specified abnormal findings of blood chemistry: Secondary | ICD-10-CM | POA: Diagnosis not present

## 2017-09-02 DIAGNOSIS — R0602 Shortness of breath: Secondary | ICD-10-CM

## 2017-09-02 DIAGNOSIS — R740 Nonspecific elevation of levels of transaminase and lactic acid dehydrogenase [LDH]: Secondary | ICD-10-CM | POA: Diagnosis not present

## 2017-09-02 DIAGNOSIS — J189 Pneumonia, unspecified organism: Secondary | ICD-10-CM | POA: Diagnosis not present

## 2017-09-03 DIAGNOSIS — J189 Pneumonia, unspecified organism: Secondary | ICD-10-CM | POA: Diagnosis not present

## 2017-09-03 DIAGNOSIS — J9690 Respiratory failure, unspecified, unspecified whether with hypoxia or hypercapnia: Secondary | ICD-10-CM | POA: Diagnosis not present

## 2017-09-03 DIAGNOSIS — R740 Nonspecific elevation of levels of transaminase and lactic acid dehydrogenase [LDH]: Secondary | ICD-10-CM | POA: Diagnosis not present

## 2017-09-03 DIAGNOSIS — R7989 Other specified abnormal findings of blood chemistry: Secondary | ICD-10-CM | POA: Diagnosis not present

## 2017-09-03 DIAGNOSIS — J9601 Acute respiratory failure with hypoxia: Secondary | ICD-10-CM | POA: Diagnosis not present

## 2017-09-04 DIAGNOSIS — R740 Nonspecific elevation of levels of transaminase and lactic acid dehydrogenase [LDH]: Secondary | ICD-10-CM | POA: Diagnosis not present

## 2017-09-04 DIAGNOSIS — J9601 Acute respiratory failure with hypoxia: Secondary | ICD-10-CM | POA: Diagnosis not present

## 2017-09-04 DIAGNOSIS — R7989 Other specified abnormal findings of blood chemistry: Secondary | ICD-10-CM | POA: Diagnosis not present

## 2017-09-04 DIAGNOSIS — J189 Pneumonia, unspecified organism: Secondary | ICD-10-CM | POA: Diagnosis not present

## 2017-09-08 DIAGNOSIS — I5022 Chronic systolic (congestive) heart failure: Secondary | ICD-10-CM | POA: Diagnosis not present

## 2017-09-08 DIAGNOSIS — R5383 Other fatigue: Secondary | ICD-10-CM | POA: Diagnosis not present

## 2017-09-08 DIAGNOSIS — J449 Chronic obstructive pulmonary disease, unspecified: Secondary | ICD-10-CM | POA: Diagnosis not present

## 2017-09-08 DIAGNOSIS — G4733 Obstructive sleep apnea (adult) (pediatric): Secondary | ICD-10-CM | POA: Diagnosis not present

## 2017-09-15 DIAGNOSIS — J449 Chronic obstructive pulmonary disease, unspecified: Secondary | ICD-10-CM | POA: Diagnosis not present

## 2017-09-16 DIAGNOSIS — Q615 Medullary cystic kidney: Secondary | ICD-10-CM | POA: Diagnosis not present

## 2017-09-16 DIAGNOSIS — Z981 Arthrodesis status: Secondary | ICD-10-CM | POA: Diagnosis not present

## 2017-09-16 DIAGNOSIS — R1032 Left lower quadrant pain: Secondary | ICD-10-CM | POA: Diagnosis not present

## 2017-09-16 DIAGNOSIS — N200 Calculus of kidney: Secondary | ICD-10-CM | POA: Diagnosis not present

## 2017-09-22 DIAGNOSIS — G4733 Obstructive sleep apnea (adult) (pediatric): Secondary | ICD-10-CM | POA: Diagnosis not present

## 2017-09-22 DIAGNOSIS — Z711 Person with feared health complaint in whom no diagnosis is made: Secondary | ICD-10-CM | POA: Diagnosis not present

## 2017-09-23 DIAGNOSIS — R918 Other nonspecific abnormal finding of lung field: Secondary | ICD-10-CM | POA: Diagnosis not present

## 2017-09-23 DIAGNOSIS — Z87891 Personal history of nicotine dependence: Secondary | ICD-10-CM | POA: Diagnosis not present

## 2017-09-23 DIAGNOSIS — I5022 Chronic systolic (congestive) heart failure: Secondary | ICD-10-CM | POA: Diagnosis not present

## 2017-09-23 DIAGNOSIS — G4733 Obstructive sleep apnea (adult) (pediatric): Secondary | ICD-10-CM | POA: Diagnosis not present

## 2017-09-23 DIAGNOSIS — R0989 Other specified symptoms and signs involving the circulatory and respiratory systems: Secondary | ICD-10-CM | POA: Diagnosis not present

## 2017-09-23 DIAGNOSIS — R0600 Dyspnea, unspecified: Secondary | ICD-10-CM | POA: Diagnosis not present

## 2017-09-23 DIAGNOSIS — I4891 Unspecified atrial fibrillation: Secondary | ICD-10-CM | POA: Diagnosis not present

## 2017-09-23 DIAGNOSIS — R05 Cough: Secondary | ICD-10-CM | POA: Diagnosis not present

## 2017-09-23 DIAGNOSIS — Z79899 Other long term (current) drug therapy: Secondary | ICD-10-CM | POA: Diagnosis not present

## 2017-09-23 DIAGNOSIS — J449 Chronic obstructive pulmonary disease, unspecified: Secondary | ICD-10-CM | POA: Diagnosis not present

## 2017-09-25 DIAGNOSIS — M4326 Fusion of spine, lumbar region: Secondary | ICD-10-CM | POA: Diagnosis not present

## 2017-09-25 DIAGNOSIS — Z6829 Body mass index (BMI) 29.0-29.9, adult: Secondary | ICD-10-CM | POA: Diagnosis not present

## 2017-09-25 DIAGNOSIS — I10 Essential (primary) hypertension: Secondary | ICD-10-CM | POA: Diagnosis not present

## 2017-09-28 DIAGNOSIS — J449 Chronic obstructive pulmonary disease, unspecified: Secondary | ICD-10-CM | POA: Diagnosis not present

## 2017-09-28 DIAGNOSIS — I5022 Chronic systolic (congestive) heart failure: Secondary | ICD-10-CM | POA: Diagnosis not present

## 2017-09-28 DIAGNOSIS — E559 Vitamin D deficiency, unspecified: Secondary | ICD-10-CM | POA: Diagnosis not present

## 2017-09-28 DIAGNOSIS — R5383 Other fatigue: Secondary | ICD-10-CM | POA: Diagnosis not present

## 2017-09-28 DIAGNOSIS — G4733 Obstructive sleep apnea (adult) (pediatric): Secondary | ICD-10-CM | POA: Diagnosis not present

## 2017-10-05 DIAGNOSIS — Z7952 Long term (current) use of systemic steroids: Secondary | ICD-10-CM | POA: Diagnosis not present

## 2017-10-05 DIAGNOSIS — Z0000 Encounter for general adult medical examination without abnormal findings: Secondary | ICD-10-CM | POA: Diagnosis not present

## 2017-10-05 DIAGNOSIS — Z23 Encounter for immunization: Secondary | ICD-10-CM | POA: Diagnosis not present

## 2017-10-06 DIAGNOSIS — Z1211 Encounter for screening for malignant neoplasm of colon: Secondary | ICD-10-CM | POA: Diagnosis not present

## 2017-10-06 DIAGNOSIS — M3500 Sicca syndrome, unspecified: Secondary | ICD-10-CM | POA: Diagnosis not present

## 2017-10-06 DIAGNOSIS — E782 Mixed hyperlipidemia: Secondary | ICD-10-CM | POA: Diagnosis not present

## 2017-10-06 DIAGNOSIS — Z87891 Personal history of nicotine dependence: Secondary | ICD-10-CM | POA: Diagnosis not present

## 2017-10-06 DIAGNOSIS — Z86010 Personal history of colonic polyps: Secondary | ICD-10-CM | POA: Diagnosis not present

## 2017-10-06 DIAGNOSIS — R002 Palpitations: Secondary | ICD-10-CM | POA: Diagnosis not present

## 2017-10-16 DIAGNOSIS — J449 Chronic obstructive pulmonary disease, unspecified: Secondary | ICD-10-CM | POA: Diagnosis not present

## 2017-10-19 DIAGNOSIS — E782 Mixed hyperlipidemia: Secondary | ICD-10-CM | POA: Diagnosis not present

## 2017-10-19 DIAGNOSIS — L930 Discoid lupus erythematosus: Secondary | ICD-10-CM | POA: Diagnosis not present

## 2017-10-19 DIAGNOSIS — I10 Essential (primary) hypertension: Secondary | ICD-10-CM | POA: Diagnosis not present

## 2017-10-19 DIAGNOSIS — D869 Sarcoidosis, unspecified: Secondary | ICD-10-CM | POA: Diagnosis not present

## 2017-10-20 DIAGNOSIS — D862 Sarcoidosis of lung with sarcoidosis of lymph nodes: Secondary | ICD-10-CM | POA: Diagnosis not present

## 2017-10-20 DIAGNOSIS — J9601 Acute respiratory failure with hypoxia: Secondary | ICD-10-CM | POA: Diagnosis not present

## 2017-10-20 DIAGNOSIS — B370 Candidal stomatitis: Secondary | ICD-10-CM | POA: Diagnosis not present

## 2017-10-20 DIAGNOSIS — Z1231 Encounter for screening mammogram for malignant neoplasm of breast: Secondary | ICD-10-CM | POA: Diagnosis not present

## 2017-10-20 DIAGNOSIS — I10 Essential (primary) hypertension: Secondary | ICD-10-CM | POA: Diagnosis not present

## 2017-10-22 DIAGNOSIS — E875 Hyperkalemia: Secondary | ICD-10-CM | POA: Diagnosis not present

## 2017-10-23 DIAGNOSIS — M47896 Other spondylosis, lumbar region: Secondary | ICD-10-CM | POA: Diagnosis not present

## 2017-10-23 DIAGNOSIS — M4326 Fusion of spine, lumbar region: Secondary | ICD-10-CM | POA: Diagnosis not present

## 2017-10-23 DIAGNOSIS — M545 Low back pain: Secondary | ICD-10-CM | POA: Diagnosis not present

## 2017-10-26 DIAGNOSIS — J301 Allergic rhinitis due to pollen: Secondary | ICD-10-CM | POA: Diagnosis not present

## 2017-10-26 DIAGNOSIS — J449 Chronic obstructive pulmonary disease, unspecified: Secondary | ICD-10-CM | POA: Diagnosis not present

## 2017-10-26 DIAGNOSIS — R0982 Postnasal drip: Secondary | ICD-10-CM | POA: Diagnosis not present

## 2017-10-26 DIAGNOSIS — I739 Peripheral vascular disease, unspecified: Secondary | ICD-10-CM | POA: Diagnosis not present

## 2017-10-28 DIAGNOSIS — Z79899 Other long term (current) drug therapy: Secondary | ICD-10-CM | POA: Diagnosis not present

## 2017-10-28 DIAGNOSIS — M3500 Sicca syndrome, unspecified: Secondary | ICD-10-CM | POA: Diagnosis not present

## 2017-10-28 DIAGNOSIS — J301 Allergic rhinitis due to pollen: Secondary | ICD-10-CM | POA: Diagnosis not present

## 2017-11-02 DIAGNOSIS — Z87442 Personal history of urinary calculi: Secondary | ICD-10-CM | POA: Diagnosis not present

## 2017-11-02 DIAGNOSIS — L819 Disorder of pigmentation, unspecified: Secondary | ICD-10-CM | POA: Diagnosis not present

## 2017-11-02 DIAGNOSIS — Z79899 Other long term (current) drug therapy: Secondary | ICD-10-CM | POA: Diagnosis not present

## 2017-11-02 DIAGNOSIS — Z7952 Long term (current) use of systemic steroids: Secondary | ICD-10-CM | POA: Diagnosis not present

## 2017-11-02 DIAGNOSIS — E041 Nontoxic single thyroid nodule: Secondary | ICD-10-CM | POA: Diagnosis not present

## 2017-11-02 DIAGNOSIS — Z8679 Personal history of other diseases of the circulatory system: Secondary | ICD-10-CM | POA: Diagnosis not present

## 2017-11-02 DIAGNOSIS — M1990 Unspecified osteoarthritis, unspecified site: Secondary | ICD-10-CM | POA: Diagnosis not present

## 2017-11-02 DIAGNOSIS — Z8701 Personal history of pneumonia (recurrent): Secondary | ICD-10-CM | POA: Diagnosis not present

## 2017-11-02 DIAGNOSIS — N281 Cyst of kidney, acquired: Secondary | ICD-10-CM | POA: Diagnosis not present

## 2017-11-02 DIAGNOSIS — R231 Pallor: Secondary | ICD-10-CM | POA: Diagnosis not present

## 2017-11-02 DIAGNOSIS — M3500 Sicca syndrome, unspecified: Secondary | ICD-10-CM | POA: Diagnosis not present

## 2017-11-15 DIAGNOSIS — J449 Chronic obstructive pulmonary disease, unspecified: Secondary | ICD-10-CM | POA: Diagnosis not present

## 2017-11-16 DIAGNOSIS — R0600 Dyspnea, unspecified: Secondary | ICD-10-CM | POA: Diagnosis not present

## 2017-11-16 DIAGNOSIS — I509 Heart failure, unspecified: Secondary | ICD-10-CM | POA: Diagnosis not present

## 2017-11-16 DIAGNOSIS — J449 Chronic obstructive pulmonary disease, unspecified: Secondary | ICD-10-CM | POA: Diagnosis not present

## 2017-11-16 DIAGNOSIS — R072 Precordial pain: Secondary | ICD-10-CM | POA: Diagnosis not present

## 2017-11-20 DIAGNOSIS — R079 Chest pain, unspecified: Secondary | ICD-10-CM | POA: Diagnosis not present

## 2017-11-20 DIAGNOSIS — I509 Heart failure, unspecified: Secondary | ICD-10-CM | POA: Diagnosis not present

## 2017-11-20 DIAGNOSIS — D869 Sarcoidosis, unspecified: Secondary | ICD-10-CM | POA: Diagnosis not present

## 2017-11-23 DIAGNOSIS — G894 Chronic pain syndrome: Secondary | ICD-10-CM | POA: Diagnosis not present

## 2017-11-23 DIAGNOSIS — M545 Low back pain: Secondary | ICD-10-CM | POA: Diagnosis not present

## 2017-11-23 DIAGNOSIS — M4326 Fusion of spine, lumbar region: Secondary | ICD-10-CM | POA: Diagnosis not present

## 2017-11-23 DIAGNOSIS — M47896 Other spondylosis, lumbar region: Secondary | ICD-10-CM | POA: Diagnosis not present

## 2017-11-23 DIAGNOSIS — M549 Dorsalgia, unspecified: Secondary | ICD-10-CM | POA: Diagnosis not present

## 2017-11-30 DIAGNOSIS — R05 Cough: Secondary | ICD-10-CM | POA: Diagnosis not present

## 2017-11-30 DIAGNOSIS — J301 Allergic rhinitis due to pollen: Secondary | ICD-10-CM | POA: Diagnosis not present

## 2017-11-30 DIAGNOSIS — J449 Chronic obstructive pulmonary disease, unspecified: Secondary | ICD-10-CM | POA: Diagnosis not present

## 2017-12-01 DIAGNOSIS — M79605 Pain in left leg: Secondary | ICD-10-CM | POA: Diagnosis not present

## 2017-12-01 DIAGNOSIS — R0602 Shortness of breath: Secondary | ICD-10-CM | POA: Diagnosis not present

## 2017-12-01 DIAGNOSIS — R072 Precordial pain: Secondary | ICD-10-CM | POA: Diagnosis not present

## 2017-12-01 DIAGNOSIS — M79604 Pain in right leg: Secondary | ICD-10-CM | POA: Diagnosis not present

## 2017-12-01 DIAGNOSIS — R0989 Other specified symptoms and signs involving the circulatory and respiratory systems: Secondary | ICD-10-CM | POA: Diagnosis not present

## 2017-12-04 DIAGNOSIS — E559 Vitamin D deficiency, unspecified: Secondary | ICD-10-CM | POA: Diagnosis not present

## 2017-12-04 DIAGNOSIS — I739 Peripheral vascular disease, unspecified: Secondary | ICD-10-CM | POA: Diagnosis not present

## 2017-12-10 DIAGNOSIS — R0989 Other specified symptoms and signs involving the circulatory and respiratory systems: Secondary | ICD-10-CM | POA: Diagnosis not present

## 2017-12-10 DIAGNOSIS — R072 Precordial pain: Secondary | ICD-10-CM | POA: Diagnosis not present

## 2017-12-10 DIAGNOSIS — M79605 Pain in left leg: Secondary | ICD-10-CM | POA: Diagnosis not present

## 2017-12-10 DIAGNOSIS — M79604 Pain in right leg: Secondary | ICD-10-CM | POA: Diagnosis not present

## 2017-12-10 DIAGNOSIS — R042 Hemoptysis: Secondary | ICD-10-CM | POA: Diagnosis not present

## 2017-12-10 DIAGNOSIS — R911 Solitary pulmonary nodule: Secondary | ICD-10-CM | POA: Diagnosis not present

## 2017-12-14 DIAGNOSIS — J301 Allergic rhinitis due to pollen: Secondary | ICD-10-CM | POA: Diagnosis not present

## 2017-12-14 DIAGNOSIS — J449 Chronic obstructive pulmonary disease, unspecified: Secondary | ICD-10-CM | POA: Diagnosis not present

## 2017-12-14 DIAGNOSIS — R918 Other nonspecific abnormal finding of lung field: Secondary | ICD-10-CM | POA: Diagnosis not present

## 2017-12-14 DIAGNOSIS — I5022 Chronic systolic (congestive) heart failure: Secondary | ICD-10-CM | POA: Diagnosis not present

## 2017-12-16 DIAGNOSIS — J449 Chronic obstructive pulmonary disease, unspecified: Secondary | ICD-10-CM | POA: Diagnosis not present

## 2017-12-21 DIAGNOSIS — Z79899 Other long term (current) drug therapy: Secondary | ICD-10-CM | POA: Diagnosis not present

## 2017-12-21 DIAGNOSIS — M47896 Other spondylosis, lumbar region: Secondary | ICD-10-CM | POA: Diagnosis not present

## 2017-12-21 DIAGNOSIS — G894 Chronic pain syndrome: Secondary | ICD-10-CM | POA: Diagnosis not present

## 2017-12-21 DIAGNOSIS — M4326 Fusion of spine, lumbar region: Secondary | ICD-10-CM | POA: Diagnosis not present

## 2017-12-21 DIAGNOSIS — M7918 Myalgia, other site: Secondary | ICD-10-CM | POA: Diagnosis not present

## 2017-12-21 DIAGNOSIS — M545 Low back pain: Secondary | ICD-10-CM | POA: Diagnosis not present

## 2018-01-15 DIAGNOSIS — J449 Chronic obstructive pulmonary disease, unspecified: Secondary | ICD-10-CM | POA: Diagnosis not present

## 2018-02-24 DIAGNOSIS — R0602 Shortness of breath: Secondary | ICD-10-CM | POA: Diagnosis not present

## 2018-02-24 DIAGNOSIS — M1990 Unspecified osteoarthritis, unspecified site: Secondary | ICD-10-CM | POA: Diagnosis not present

## 2018-02-24 DIAGNOSIS — J449 Chronic obstructive pulmonary disease, unspecified: Secondary | ICD-10-CM | POA: Diagnosis not present

## 2018-02-24 DIAGNOSIS — Z87891 Personal history of nicotine dependence: Secondary | ICD-10-CM | POA: Diagnosis not present

## 2018-02-24 DIAGNOSIS — Z7952 Long term (current) use of systemic steroids: Secondary | ICD-10-CM | POA: Diagnosis not present

## 2018-02-24 DIAGNOSIS — Z79899 Other long term (current) drug therapy: Secondary | ICD-10-CM | POA: Diagnosis not present

## 2018-02-24 DIAGNOSIS — M3500 Sicca syndrome, unspecified: Secondary | ICD-10-CM | POA: Diagnosis not present

## 2018-03-30 DIAGNOSIS — N200 Calculus of kidney: Secondary | ICD-10-CM | POA: Diagnosis not present

## 2018-03-30 DIAGNOSIS — Q615 Medullary cystic kidney: Secondary | ICD-10-CM | POA: Diagnosis not present

## 2018-03-30 DIAGNOSIS — R1012 Left upper quadrant pain: Secondary | ICD-10-CM | POA: Diagnosis not present

## 2018-03-30 DIAGNOSIS — R32 Unspecified urinary incontinence: Secondary | ICD-10-CM | POA: Diagnosis not present

## 2018-05-11 DIAGNOSIS — N200 Calculus of kidney: Secondary | ICD-10-CM | POA: Diagnosis not present

## 2018-05-11 DIAGNOSIS — R32 Unspecified urinary incontinence: Secondary | ICD-10-CM | POA: Diagnosis not present

## 2018-05-11 DIAGNOSIS — R1032 Left lower quadrant pain: Secondary | ICD-10-CM | POA: Diagnosis not present

## 2018-05-11 DIAGNOSIS — Q615 Medullary cystic kidney: Secondary | ICD-10-CM | POA: Diagnosis not present

## 2018-05-17 DIAGNOSIS — J449 Chronic obstructive pulmonary disease, unspecified: Secondary | ICD-10-CM | POA: Diagnosis not present

## 2018-06-10 DIAGNOSIS — Z79899 Other long term (current) drug therapy: Secondary | ICD-10-CM | POA: Diagnosis not present

## 2018-06-10 DIAGNOSIS — J320 Chronic maxillary sinusitis: Secondary | ICD-10-CM | POA: Diagnosis not present

## 2018-06-10 DIAGNOSIS — W1809XA Striking against other object with subsequent fall, initial encounter: Secondary | ICD-10-CM | POA: Diagnosis not present

## 2018-06-10 DIAGNOSIS — S0993XA Unspecified injury of face, initial encounter: Secondary | ICD-10-CM | POA: Diagnosis not present

## 2018-06-10 DIAGNOSIS — S0990XA Unspecified injury of head, initial encounter: Secondary | ICD-10-CM | POA: Diagnosis not present

## 2018-06-10 DIAGNOSIS — Y92018 Other place in single-family (private) house as the place of occurrence of the external cause: Secondary | ICD-10-CM | POA: Diagnosis not present

## 2018-06-10 DIAGNOSIS — S098XXA Other specified injuries of head, initial encounter: Secondary | ICD-10-CM | POA: Diagnosis not present

## 2018-06-10 DIAGNOSIS — S01112A Laceration without foreign body of left eyelid and periocular area, initial encounter: Secondary | ICD-10-CM | POA: Diagnosis not present

## 2018-06-10 DIAGNOSIS — S0512XA Contusion of eyeball and orbital tissues, left eye, initial encounter: Secondary | ICD-10-CM | POA: Diagnosis not present

## 2018-06-10 DIAGNOSIS — Y93D9 Activity, other involving arts and handcrafts: Secondary | ICD-10-CM | POA: Diagnosis not present

## 2018-06-16 DIAGNOSIS — J449 Chronic obstructive pulmonary disease, unspecified: Secondary | ICD-10-CM | POA: Diagnosis not present

## 2018-06-23 DIAGNOSIS — Q615 Medullary cystic kidney: Secondary | ICD-10-CM | POA: Diagnosis not present

## 2018-06-23 DIAGNOSIS — K5909 Other constipation: Secondary | ICD-10-CM | POA: Diagnosis not present

## 2018-06-23 DIAGNOSIS — N200 Calculus of kidney: Secondary | ICD-10-CM | POA: Diagnosis not present

## 2018-06-23 DIAGNOSIS — R1032 Left lower quadrant pain: Secondary | ICD-10-CM | POA: Diagnosis not present

## 2018-07-05 DIAGNOSIS — R1032 Left lower quadrant pain: Secondary | ICD-10-CM | POA: Diagnosis not present

## 2018-07-05 DIAGNOSIS — N200 Calculus of kidney: Secondary | ICD-10-CM | POA: Diagnosis not present

## 2018-07-05 DIAGNOSIS — K5909 Other constipation: Secondary | ICD-10-CM | POA: Diagnosis not present

## 2018-07-05 DIAGNOSIS — Q615 Medullary cystic kidney: Secondary | ICD-10-CM | POA: Diagnosis not present

## 2018-08-25 DIAGNOSIS — J342 Deviated nasal septum: Secondary | ICD-10-CM | POA: Diagnosis not present

## 2018-08-25 DIAGNOSIS — J320 Chronic maxillary sinusitis: Secondary | ICD-10-CM | POA: Diagnosis not present

## 2018-08-25 DIAGNOSIS — R6884 Jaw pain: Secondary | ICD-10-CM | POA: Diagnosis not present

## 2018-08-25 DIAGNOSIS — E041 Nontoxic single thyroid nodule: Secondary | ICD-10-CM | POA: Diagnosis not present

## 2018-09-23 DIAGNOSIS — E042 Nontoxic multinodular goiter: Secondary | ICD-10-CM | POA: Diagnosis not present

## 2018-09-23 DIAGNOSIS — R32 Unspecified urinary incontinence: Secondary | ICD-10-CM | POA: Diagnosis not present

## 2018-09-23 DIAGNOSIS — R109 Unspecified abdominal pain: Secondary | ICD-10-CM | POA: Diagnosis not present

## 2018-09-23 DIAGNOSIS — E041 Nontoxic single thyroid nodule: Secondary | ICD-10-CM | POA: Diagnosis not present

## 2018-09-23 DIAGNOSIS — N200 Calculus of kidney: Secondary | ICD-10-CM | POA: Diagnosis not present

## 2018-09-28 DIAGNOSIS — N200 Calculus of kidney: Secondary | ICD-10-CM | POA: Diagnosis not present

## 2018-09-29 DIAGNOSIS — N200 Calculus of kidney: Secondary | ICD-10-CM | POA: Diagnosis not present

## 2018-09-29 DIAGNOSIS — R3129 Other microscopic hematuria: Secondary | ICD-10-CM | POA: Diagnosis not present

## 2018-09-29 DIAGNOSIS — R1032 Left lower quadrant pain: Secondary | ICD-10-CM | POA: Diagnosis not present

## 2018-09-29 DIAGNOSIS — R1012 Left upper quadrant pain: Secondary | ICD-10-CM | POA: Diagnosis not present

## 2018-09-30 DIAGNOSIS — N200 Calculus of kidney: Secondary | ICD-10-CM | POA: Diagnosis not present

## 2018-09-30 DIAGNOSIS — E042 Nontoxic multinodular goiter: Secondary | ICD-10-CM | POA: Diagnosis not present

## 2019-02-22 DIAGNOSIS — K219 Gastro-esophageal reflux disease without esophagitis: Secondary | ICD-10-CM

## 2019-02-22 DIAGNOSIS — J449 Chronic obstructive pulmonary disease, unspecified: Secondary | ICD-10-CM

## 2019-02-22 HISTORY — DX: Gastro-esophageal reflux disease without esophagitis: K21.9

## 2019-02-22 HISTORY — DX: Chronic obstructive pulmonary disease, unspecified: J44.9

## 2019-03-02 DIAGNOSIS — Z87442 Personal history of urinary calculi: Secondary | ICD-10-CM

## 2019-03-02 DIAGNOSIS — I2510 Atherosclerotic heart disease of native coronary artery without angina pectoris: Secondary | ICD-10-CM

## 2019-03-02 HISTORY — DX: Personal history of urinary calculi: Z87.442

## 2019-03-02 HISTORY — DX: Atherosclerotic heart disease of native coronary artery without angina pectoris: I25.10

## 2019-06-16 DIAGNOSIS — R9430 Abnormal result of cardiovascular function study, unspecified: Secondary | ICD-10-CM

## 2019-06-16 HISTORY — DX: Abnormal result of cardiovascular function study, unspecified: R94.30

## 2020-01-11 ENCOUNTER — Other Ambulatory Visit: Payer: Self-pay | Admitting: Orthopaedic Surgery

## 2020-01-11 DIAGNOSIS — M4326 Fusion of spine, lumbar region: Secondary | ICD-10-CM

## 2020-01-24 ENCOUNTER — Other Ambulatory Visit: Payer: Self-pay | Admitting: Orthopaedic Surgery

## 2020-01-24 ENCOUNTER — Other Ambulatory Visit: Payer: Self-pay

## 2020-01-24 ENCOUNTER — Ambulatory Visit
Admission: RE | Admit: 2020-01-24 | Discharge: 2020-01-24 | Disposition: A | Discharge status: Home / Self Care | Payer: Medicare HMO | Source: Ambulatory Visit | Attending: Orthopaedic Surgery | Admitting: Orthopaedic Surgery

## 2020-01-24 DIAGNOSIS — M4326 Fusion of spine, lumbar region: Secondary | ICD-10-CM

## 2020-02-04 ENCOUNTER — Other Ambulatory Visit: Payer: BLUE CROSS/BLUE SHIELD

## 2020-05-12 ENCOUNTER — Encounter: Payer: Self-pay | Admitting: Gastroenterology

## 2020-12-26 DIAGNOSIS — R918 Other nonspecific abnormal finding of lung field: Secondary | ICD-10-CM

## 2020-12-26 HISTORY — DX: Other nonspecific abnormal finding of lung field: R91.8

## 2021-02-04 ENCOUNTER — Encounter: Payer: Self-pay | Admitting: Gastroenterology

## 2021-02-27 ENCOUNTER — Encounter: Payer: Self-pay | Admitting: Gastroenterology

## 2021-02-27 ENCOUNTER — Other Ambulatory Visit: Payer: Self-pay

## 2021-02-27 ENCOUNTER — Ambulatory Visit: Payer: Medicare HMO | Admitting: Gastroenterology

## 2021-02-27 VITALS — BP 132/84 | HR 62 | Ht 72.0 in | Wt 202.2 lb

## 2021-02-27 DIAGNOSIS — R1032 Left lower quadrant pain: Secondary | ICD-10-CM | POA: Diagnosis not present

## 2021-02-27 DIAGNOSIS — K219 Gastro-esophageal reflux disease without esophagitis: Secondary | ICD-10-CM | POA: Diagnosis not present

## 2021-02-27 DIAGNOSIS — Z86010 Personal history of colonic polyps: Secondary | ICD-10-CM

## 2021-02-27 DIAGNOSIS — K449 Diaphragmatic hernia without obstruction or gangrene: Secondary | ICD-10-CM | POA: Diagnosis not present

## 2021-02-27 MED ORDER — OMEPRAZOLE 20 MG PO CPDR: 20.0000 mg | DELAYED_RELEASE_CAPSULE | Freq: Every day | ORAL | 11 refills | Status: DC

## 2021-02-27 NOTE — Patient Instructions (Addendum)
If you are age 55 or older, your body mass index should be between 23-30. Your Body mass index is 27.43 kg/m. If this is out of the aforementioned range listed, please consider follow up with your Primary Care Provider.  If you are age 48 or younger, your body mass index should be between 19-25. Your Body mass index is 27.43 kg/m. If this is out of the aformentioned range listed, please consider follow up with your Primary Care Provider.   __________________________________________________________  The [facility] providers would like to encourage you to use [facility] to communicate with providers for non-urgent requests or questions.  Due to long hold times on the telephone, sending your provider a message by [facility] may be a faster and more efficient way to get a response.  Please allow 48 business hours for a response.  Please remember that this is for non-urgent requests.   We have sent the following medications to your pharmacy for you to pick up at your convenience:  Omeprazole 20mg  You have been given a sample of Clenpiq for your colonsocopy  Due to recent changes in healthcare laws, you may see the results of your imaging and laboratory studies on MyChart before your provider has had a chance to review them.  We understand that in some cases there may be results that are confusing or concerning to you. Not all laboratory results come back in the same time frame and the provider may be waiting for multiple results in order to interpret others.  Please give [place] 48 hours in order for your provider to thoroughly review all the results before contacting the office for clarification of your results.   It was a pleasure to see you today!  Jackquline Denmark, M.D.

## 2021-02-27 NOTE — Progress Notes (Signed)
Chief Complaint: For GI eval  Referring Provider:  No ref. provider found      ASSESSMENT AND PLAN;   #1. H/O polyps  #2.  Intermittent constipation with LLQ pain (resolved)  #3. GERD with small HH  Plan: -Colon for further eval -Omeprazole 20mg  po QD to continue. Can try 1/2 before dinner -Continue Metamucil with 8oz water QD. -If with any further problems, would consider CT Abdo/pelvis with contrast.   Discussed risks & benefits of colonoscopy. Risks including rare perforation req laparotomy, bleeding after bx/polypectomy req blood transfusion, rarely missing neoplasms, risks of anesthesia/sedation, rare risk of damage to internal organs. Benefits outweigh the risks. Patient agrees to proceed. All the questions were answered. Pt consents to proceed. HPI:    Angel Avila is a 56 y.o. female  Sjogren's syndrome with inflammatory arthritis/pulm nodules/neuropathy on imuran/Plaquenil/oxycodone  No significant GI complaints.  She does have morning nausea which she had for many years.  Likely related to polypharmacy including narcotics.  Occasional constipation with LLQ discomfort which gets better with BMs.  She has been taking Metamucil with plenty of water every day with resultant improvement.  No recent weight loss or loss of appetite.  No fever chills or night sweats.  No jaundice dark urine or pale stools.  Here for colonoscopy.    Past GI work-up:  EGD 09/10/2016 -Mild gastritis -Small hiatal hernia. -Neg SB bx for celiac  Colonoscopy 11/24/2016: -1 cm polyp s/p polypectomy. Bx-tubular adenoma -Repeat in 3 years   CT chest 05/2020 1. No evidence for acute pulmonary embolus. 2. 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 3. Stable to improved appearance of scattered, less than 5 mm  lung nodules. As mentioned on the previous exam, in this high risk patient the remaining tiny nodules can be followed up with non-contrast chest CT in 12 months. This recommendation follows the consensus statement: Guidelines for Management of Incidental Pulmonary Nodules Detected on CT images: From the Fleischner Society 2017; Radiology 017; [phone]. 4. Nonobstructing right renal calculus.  Aortic Atherosclerosis (ICD10-I70.0).  Past Medical History:  Diagnosis Date   CAD (coronary artery disease), native coronary artery 03/02/2019   COPD (chronic obstructive pulmonary disease) ([place]) 02/22/2019   History of nephrolithiasis 03/02/2019   Inflammatory arthritis 07/14/2014   Laryngopharyngeal reflux 02/22/2019   Obstructive sleep apnea on CPAP    Palpitations 03/03/2017   Pulmonary nodules 12/26/2020   careeverywhere   Restless leg syndrome    Sjogren's syndrome ([place]) 01/19/2017   Spondylolisthesis of lumbar region 02/17/2017   SVT (supraventricular tachycardia) (HCC)    TACS (trigeminal autonomic cephalgias)    Trigeminal neuralgia    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    Past Surgical History:  Procedure Laterality Date   BACK SURGERY     BRONCHOSCOPY     COLONOSCOPY     orif left elbow     SALIVARY STONE REMOVAL     TONSILLECTOMY     TOTAL ABDOMINAL HYSTERECTOMY      Family History  Problem Relation Age of Onset   Heart disease Mother    Breast cancer Mother    Coronary artery disease Mother    Hypertension Mother    Lung disease Mother    Lupus Mother    Thyroid disease Mother    Liver cancer Mother    Arthritis/Rheumatoid Father  Lupus Sister    Diabetes Sister    Breast cancer Sister    Diabetes Brother    Crohn's disease Brother    Autoimmune disease Brother        hyperhydrosis   Glaucoma Maternal Grandmother    Macular degeneration Maternal Grandmother    Uterine cancer Paternal Grandmother    Colon cancer  Paternal Grandmother    Autoimmune disease Daughter        hyperhydrosis   Stomach cancer Neg Hx    Rectal cancer Neg Hx     Social History   Tobacco Use   Smoking status: Former    Types: Cigarettes   Smokeless tobacco: Never  Vaping Use   Vaping Use: Never used  Substance Use Topics   Alcohol use: Not Currently   Drug use: Not Currently    Current Outpatient Medications  Medication Sig Dispense Refill   azaTHIOprine (IMURAN) 50 MG tablet Take 1 tablet by mouth in the morning and at bedtime.     budesonide (PULMICORT) 1 MG/2ML nebulizer solution Inhale 1 mg into the lungs in the morning and at bedtime.     carvedilol (COREG) 12.5 MG tablet Take 12.5 mg by mouth daily.     Cholecalciferol 50 MCG (2000 UT) TABS Take 50 mcg by mouth daily.     Evolocumab (REPATHA SURECLICK) 941 MG/ML SOAJ Inject 140 mg into the skin every 14 (fourteen) days.     ezetimibe (ZETIA) 10 MG tablet Take 1 tablet by mouth daily.     FLUoxetine (PROZAC) 40 MG capsule Take 1 capsule by mouth 2 (two) times daily.     fluticasone (FLONASE) 50 MCG/ACT nasal spray Place 1 spray into both nostrils daily.     Fluticasone-Umeclidin-Vilant (TRELEGY ELLIPTA) 200-62.5-25 MCG/ACT AEPB Inhale 1 puff into the lungs daily.     folic acid (FOLVITE) 1 MG tablet Take 1 mg by mouth 2 (two) times daily.     hydroxychloroquine (PLAQUENIL) 200 MG tablet Take 2 tablets by mouth daily.     ipratropium-albuterol (DUONEB) 0.5-2.5 (3) MG/3ML SOLN Inhale 3 mLs into the lungs every 6 (six) hours.     montelukast (SINGULAIR) 10 MG tablet Take 1 tablet by mouth at bedtime.     omeprazole (PRILOSEC) 20 MG capsule Take 20 mg by mouth daily.     Oxycodone HCl 10 MG TABS Take 1 tablet by mouth 2 (two) times daily as needed.     pilocarpine (SALAGEN) 5 MG tablet Take 1 tablet by mouth 3 (three) times daily.     POLYETHYLENE GLYCOL 400 OP Apply 1 drop to eye 4 (four) times daily.     predniSONE (DELTASONE) 5 MG tablet Take 1 tablet by mouth  daily.     rosuvastatin (CRESTOR) 40 MG tablet Take 1 tablet by mouth daily.     solifenacin (VESICARE) 5 MG tablet Take 5 mg by mouth every evening.     tamsulosin (FLOMAX) 0.4 MG CAPS capsule Take 0.4 mg by mouth at bedtime.     albuterol (VENTOLIN HFA) 108 (90 Base) MCG/ACT inhaler Inhale 2 puffs into the lungs every 6 (six) hours.     No current facility-administered medications for this visit.    Allergies  Allergen Reactions   Sulfa Antibiotics     Review of Systems:  Constitutional: Denies fever, chills, diaphoresis, appetite change and has fatigue.  HEENT: Denies photophobia, eye pain, redness, hearing loss, ear pain, congestion, sore throat, rhinorrhea, sneezing, mouth sores, neck pain, neck stiffness and  tinnitus.   Respiratory: Denies SOB, DOE, cough, chest tightness,  and wheezing.   Cardiovascular: Denies chest pain, palpitations and leg swelling.  Genitourinary: Denies dysuria, urgency, frequency, hematuria, flank pain and difficulty urinating.  Musculoskeletal: Has myalgias, back pain, joint swelling, arthralgias and gait problem.  Skin: No rash.  Neurological: Denies dizziness, seizures, syncope, weakness, light-headedness, numbness and headaches.  Hematological: Denies adenopathy. Easy bruising, personal or family bleeding history  Psychiatric/Behavioral: Has anxiety or depression     Physical Exam:    BP 132/84    Pulse 62    Ht 6' (1.829 m)    Wt 202 lb 4 oz (91.7 kg)    SpO2 98%    BMI 27.43 kg/m  Wt Readings from Last 3 Encounters:  02/27/21 202 lb 4 oz (91.7 kg)   Constitutional:  Well-developed, in no acute distress. Psychiatric: Normal mood and affect. Behavior is normal. HEENT: Pupils normal.  Conjunctivae are normal. No scleral icterus. Cardiovascular: Normal rate, regular rhythm. No edema Pulmonary/chest: Effort normal and breath sounds normal. No wheezing, rales or rhonchi. Abdominal: Soft, nondistended. Nontender. Bowel sounds active throughout.  There are no masses palpable. No hepatomegaly. Rectal: Deferred Neurological: Alert and oriented to person place and time. Skin: Skin is warm and dry. No rashes noted.     Carmell Austria, MD 02/27/2021, 9:19 AM  Cc: No ref. provider found

## 2021-02-28 ENCOUNTER — Encounter: Payer: Self-pay | Admitting: Gastroenterology

## 2021-03-06 ENCOUNTER — Encounter: Payer: Self-pay | Admitting: Gastroenterology

## 2021-03-06 ENCOUNTER — Ambulatory Visit (AMBULATORY_SURGERY_CENTER): Payer: Medicare HMO | Admitting: Gastroenterology

## 2021-03-06 VITALS — BP 114/53 | HR 57 | Temp 98.0°F | Resp 19 | Ht 72.0 in | Wt 202.0 lb

## 2021-03-06 DIAGNOSIS — Z86010 Personal history of colonic polyps: Secondary | ICD-10-CM

## 2021-03-06 DIAGNOSIS — R1032 Left lower quadrant pain: Secondary | ICD-10-CM

## 2021-03-06 MED ORDER — SODIUM CHLORIDE 0.9 % IV SOLN: 500.0000 mL | Freq: Once | INTRAVENOUS | Status: DC

## 2021-03-06 NOTE — Patient Instructions (Signed)
Discharge instructions given. Rescheduled procedure.instructions given. Resume previous medications. YOU HAD AN ENDOSCOPIC PROCEDURE TODAY AT [facility]:   Refer to the procedure report that was given to you for any specific questions about what was found during the examination.  If the procedure report does not answer your questions, please call your gastroenterologist to clarify.  If you requested that your care partner not be given the details of your procedure findings, then the procedure report has been included in a sealed envelope for you to review at your convenience later.  YOU SHOULD EXPECT: Some feelings of bloating in the abdomen. Passage of more gas than usual.  Walking can help get rid of the air that was put into your GI tract during the procedure and reduce the bloating. If you had a lower endoscopy (such as a colonoscopy or flexible sigmoidoscopy) you may notice spotting of blood in your stool or on the toilet paper. If you underwent a bowel prep for your procedure, you may not have a normal bowel movement for a few days.  Please Note:  You might notice some irritation and congestion in your nose or some drainage.  This is from the oxygen used during your procedure.  There is no need for concern and it should clear up in a day or so.  SYMPTOMS TO REPORT IMMEDIATELY:  Following lower endoscopy (colonoscopy or flexible sigmoidoscopy):  Excessive amounts of blood in the stool  Significant tenderness or worsening of abdominal pains  Swelling of the abdomen that is new, acute  Fever of 100F or higher   For urgent or emergent issues, a gastroenterologist can be reached at any hour by calling [phone]. Do not use MyChart messaging for urgent concerns.    DIET:  We do recommend a small meal at first, but then you may proceed to your regular diet.  Drink plenty of fluids but you should avoid alcoholic beverages for 24 hours.  ACTIVITY:  You should plan to take  it easy for the rest of today and you should NOT DRIVE or use heavy machinery until tomorrow (because of the sedation medicines used during the test).    FOLLOW UP: Our staff will call the number listed on your records 48-72 hours following your procedure to check on you and address any questions or concerns that you may have regarding the information given to you following your procedure. If we do not reach you, we will leave a message.  We will attempt to reach you two times.  During this call, we will ask if you have developed any symptoms of COVID 19. If you develop any symptoms (ie: fever, flu-like symptoms, shortness of breath, cough etc.) before then, please call [phone].  If you test positive for Covid 19 in the 2 weeks post procedure, please call and report this information to [place].    If any biopsies were taken you will be contacted by phone or by letter within the next 1-3 weeks.  Please call us at [phone] if you have not heard about the biopsies in 3 weeks.    SIGNATURES/CONFIDENTIALITY: You and/or your care partner have signed paperwork which will be entered into your electronic medical record.  These signatures attest to the fact that that the information above on your After Visit Summary has been reviewed and is understood.  Full responsibility of the confidentiality of this discharge information lies with you and/or your care-partner.

## 2021-03-06 NOTE — Op Note (Signed)
[facility] Patient Name: Angel Avila Procedure Date: 03/06/2021 2:05 PM MRN: [id number] Endoscopist: Jackquline Denmark , MD Age: 56 Referring MD:  Date of Birth: 1965-04-25 Gender: Female Account #: [id number] Procedure:                Colonoscopy Indications:              High risk colon cancer surveillance: Personal                            history of colonic polyps Medicines:                Monitored Anesthesia Care Procedure:                Pre-Anesthesia Assessment:                           - Prior to the procedure, a History and Physical                            was performed, and patient medications and                            allergies were reviewed. The patient's tolerance of                            previous anesthesia was also reviewed. The risks                            and benefits of the procedure and the sedation                            options and risks were discussed with the patient.                            All questions were answered, and informed consent                            was obtained. Prior Anticoagulants: The patient has                            taken no previous anticoagulant or antiplatelet                            agents. ASA Grade Assessment: II - A patient with                            mild systemic disease. After reviewing the risks                            and benefits, the patient was deemed in                            satisfactory condition to undergo the procedure.  After obtaining informed consent, the colonoscope                            was passed under direct vision. Throughout the                            procedure, the patient's blood pressure, pulse, and                            oxygen saturations were monitored continuously. The                            Olympus PCF-[id number] (#[id number]) Colonoscope was                            introduced through the anus and advanced to  the the                            descending colon for evaluation. The colonoscopy                            was performed without difficulty. The patient                            tolerated the procedure well. The quality of the                            bowel preparation was unsatisfactory. The                            colonoscopy was aborted due to inadequate bowel                            prep. Scope In: 2:12:47 PM Scope Out: 2:23:07 PM Total Procedure Duration: 0 hours 10 minutes 20 seconds  Findings:                 The sigmoid colon appeared normal. Limited                            examination due to retained solid stool. Complications:            No immediate complications. Estimated Blood Loss:     Estimated blood loss: none. Impression:               - Preparation of the colon was unsatisfactory.                           - The procedure was aborted due to inadequate bowel                            prep.                           - The sigmoid colon is normal.                           -  No specimens collected. Recommendation:           - Patient has a contact number available for                            emergencies. The signs and symptoms of potential                            delayed complications were discussed with the                            patient. Return to normal activities tomorrow.                            Written discharge instructions were provided to the                            patient.                           - Resume previous diet.                           - Continue present medications.                           - Repeat colonoscopy at next available appointment                            (within 3 months) for surveillance with 2-day prep.                           - Return to GI office PRN. Jackquline Denmark, MD 03/06/2021 2:28:26 PM This report has been signed electronically.

## 2021-03-06 NOTE — Progress Notes (Signed)
Chief Complaint: For GI eval  Referring Provider:  Jackquline Denmark, MD      ASSESSMENT AND PLAN;   #1. H/O polyps  #2.  Intermittent constipation with LLQ pain (resolved)  #3. GERD with small HH  Plan: -Colon for further eval -Omeprazole 20mg  po QD to continue. Can try 1/2 before dinner -Continue Metamucil with 8oz water QD. -If with any further problems, would consider CT Abdo/pelvis with contrast.   Discussed risks & benefits of colonoscopy. Risks including rare perforation req laparotomy, bleeding after bx/polypectomy req blood transfusion, rarely missing neoplasms, risks of anesthesia/sedation, rare risk of damage to internal organs. Benefits outweigh the risks. Patient agrees to proceed. All the questions were answered. Pt consents to proceed. HPI:    Angel Avila is a 56 y.o. female  Sjogren's syndrome with inflammatory arthritis/pulm nodules/neuropathy on imuran/Plaquenil/oxycodone  No significant GI complaints.  She does have morning nausea which she had for many years.  Likely related to polypharmacy including narcotics.  Occasional constipation with LLQ discomfort which gets better with BMs.  She has been taking Metamucil with plenty of water every day with resultant improvement.  No recent weight loss or loss of appetite.  No fever chills or night sweats.  No jaundice dark urine or pale stools.  Here for colonoscopy.    Past GI work-up:  EGD 09/10/2016 -Mild gastritis -Small hiatal hernia. -Neg SB bx for celiac  Colonoscopy 11/24/2016: -1 cm polyp s/p polypectomy. Bx-tubular adenoma -Repeat in 3 years   CT chest 05/2020 1. No evidence for acute pulmonary embolus. 2. 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 3. Stable to improved appearance of scattered, less than 5 mm  lung nodules. As mentioned on the previous exam, in this high risk patient the remaining tiny nodules can be followed up with non-contrast chest CT in 12 months. This recommendation follows the consensus statement: Guidelines for Management of Incidental Pulmonary Nodules Detected on CT images: From the Fleischner Society 2017; Radiology 017; [phone]. 4. Nonobstructing right renal calculus.  Aortic Atherosclerosis (ICD10-I70.0).  Past Medical History:  Diagnosis Date   CAD (coronary artery disease), native coronary artery 03/02/2019   COPD (chronic obstructive pulmonary disease) ([facility]) 02/22/2019   History of nephrolithiasis 03/02/2019   Inflammatory arthritis 07/14/2014   Laryngopharyngeal reflux 02/22/2019   Obstructive sleep apnea on CPAP    Palpitations 03/03/2017   Pulmonary nodules 12/26/2020   careeverywhere   Restless leg syndrome    Sjogren's syndrome ([facility]) 01/19/2017   Spondylolisthesis of lumbar region 02/17/2017   SVT (supraventricular tachycardia) ([facility])    TACS (trigeminal autonomic cephalgias)    Trigeminal neuralgia    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    Past Surgical History:  Procedure Laterality Date   BACK SURGERY     BRONCHOSCOPY     COLONOSCOPY     orif left elbow     SALIVARY STONE REMOVAL     TONSILLECTOMY     TOTAL ABDOMINAL HYSTERECTOMY      Family History  Problem Relation Age of Onset   Heart disease Mother    Breast cancer Mother    Coronary artery disease Mother    Hypertension Mother    Lung disease Mother    Lupus Mother    Thyroid disease Mother    Liver cancer Mother    Arthritis/Rheumatoid Father  Lupus Sister    Diabetes Sister    Breast cancer Sister    Diabetes Brother    Crohn's disease Brother    Autoimmune disease Brother        hyperhydrosis   Glaucoma Maternal Grandmother    Macular degeneration Maternal Grandmother    Uterine cancer Paternal Grandmother    Colon cancer  Paternal Grandmother    Autoimmune disease Daughter        hyperhydrosis   Stomach cancer Neg Hx    Rectal cancer Neg Hx     Social History   Tobacco Use   Smoking status: Former    Types: Cigarettes   Smokeless tobacco: Never  Vaping Use   Vaping Use: Never used  Substance Use Topics   Alcohol use: Not Currently   Drug use: Not Currently    Current Outpatient Medications  Medication Sig Dispense Refill   azaTHIOprine (IMURAN) 50 MG tablet Take 1 tablet by mouth in the morning and at bedtime.     budesonide (PULMICORT) 1 MG/2ML nebulizer solution Inhale 1 mg into the lungs in the morning and at bedtime.     carvedilol (COREG) 12.5 MG tablet Take 12.5 mg by mouth daily.     ezetimibe (ZETIA) 10 MG tablet Take 1 tablet by mouth daily.     FLUoxetine (PROZAC) 40 MG capsule Take 1 capsule by mouth 2 (two) times daily.     fluticasone (FLONASE) 50 MCG/ACT nasal spray Place 1 spray into both nostrils daily.     Fluticasone-Umeclidin-Vilant (TRELEGY ELLIPTA) 200-62.5-25 MCG/ACT AEPB Inhale 1 puff into the lungs daily.     hydroxychloroquine (PLAQUENIL) 200 MG tablet Take 2 tablets by mouth daily.     ipratropium-albuterol (DUONEB) 0.5-2.5 (3) MG/3ML SOLN Inhale 3 mLs into the lungs every 6 (six) hours.     montelukast (SINGULAIR) 10 MG tablet Take 1 tablet by mouth at bedtime.     omeprazole (PRILOSEC) 20 MG capsule Take 1 capsule (20 mg total) by mouth daily. 30 capsule 11   oxcarbazepine (TRILEPTAL) 600 MG tablet Take by mouth.     Oxycodone HCl 10 MG TABS Take 1 tablet by mouth 2 (two) times daily as needed.     pilocarpine (SALAGEN) 5 MG tablet Take 1 tablet by mouth 3 (three) times daily.     POLYETHYLENE GLYCOL 400 OP Apply 1 drop to eye 4 (four) times daily.     predniSONE (DELTASONE) 5 MG tablet Take 1 tablet by mouth daily.     rosuvastatin (CRESTOR) 40 MG tablet Take 1 tablet by mouth daily.     solifenacin (VESICARE) 5 MG tablet Take 5 mg by mouth every evening.      tamsulosin (FLOMAX) 0.4 MG CAPS capsule Take 0.4 mg by mouth at bedtime.     albuterol (VENTOLIN HFA) 108 (90 Base) MCG/ACT inhaler Inhale 2 puffs into the lungs every 6 (six) hours.     Cholecalciferol 50 MCG (2000 UT) TABS Take 50 mcg by mouth daily.     Evolocumab (REPATHA SURECLICK) 315 MG/ML SOAJ Inject 140 mg into the skin every 14 (fourteen) days.     folic acid (FOLVITE) 1 MG tablet Take 1 mg by mouth 2 (two) times daily.     Current Facility-Administered Medications  Medication Dose Route Frequency Provider Last Rate Last Admin   0.9 %  sodium chloride infusion  500 mL Intravenous Once Jackquline Denmark, MD        Allergies  Allergen Reactions   Sulfa  Antibiotics     Review of Systems:  Constitutional: Denies fever, chills, diaphoresis, appetite change and has fatigue.  HEENT: Denies photophobia, eye pain, redness, hearing loss, ear pain, congestion, sore throat, rhinorrhea, sneezing, mouth sores, neck pain, neck stiffness and tinnitus.   Respiratory: Denies SOB, DOE, cough, chest tightness,  and wheezing.   Cardiovascular: Denies chest pain, palpitations and leg swelling.  Genitourinary: Denies dysuria, urgency, frequency, hematuria, flank pain and difficulty urinating.  Musculoskeletal: Has myalgias, back pain, joint swelling, arthralgias and gait problem.  Skin: No rash.  Neurological: Denies dizziness, seizures, syncope, weakness, light-headedness, numbness and headaches.  Hematological: Denies adenopathy. Easy bruising, personal or family bleeding history  Psychiatric/Behavioral: Has anxiety or depression     Physical Exam:    BP 130/72    Pulse 65    Temp 98 F (36.7 C) (Skin)    Ht 6' (1.829 m)    Wt 202 lb (91.6 kg)    SpO2 98%    BMI 27.40 kg/m  Wt Readings from Last 3 Encounters:  03/06/21 202 lb (91.6 kg)  02/27/21 202 lb 4 oz (91.7 kg)   Constitutional:  Well-developed, in no acute distress. Psychiatric: Normal mood and affect. Behavior is normal. HEENT:  Pupils normal.  Conjunctivae are normal. No scleral icterus. Cardiovascular: Normal rate, regular rhythm. No edema Pulmonary/chest: Effort normal and breath sounds normal. No wheezing, rales or rhonchi. Abdominal: Soft, nondistended. Nontender. Bowel sounds active throughout. There are no masses palpable. No hepatomegaly. Rectal: Deferred Neurological: Alert and oriented to person place and time. Skin: Skin is warm and dry. No rashes noted.     Carmell Austria, MD 03/06/2021, 2:04 PM  Cc: Jackquline Denmark, MD

## 2021-03-06 NOTE — Progress Notes (Signed)
Pt's states no medical or surgical changes since previsit or office visit. 

## 2021-03-06 NOTE — Progress Notes (Signed)
PT taken to PACU. Monitors in place. VSS. Report given to RN. 

## 2021-03-08 ENCOUNTER — Telehealth: Payer: Self-pay | Admitting: *Deleted

## 2021-03-08 NOTE — Telephone Encounter (Signed)
°  Follow up Call-  Call back number 03/06/2021  Post procedure Call Back phone  # [phone]  Permission to leave phone message Yes  Some recent data might be hidden     Patient questions:  Do you have a fever, pain , or abdominal swelling? No. Pain Score  0 *  Have you tolerated food without any problems? Yes.    Have you been able to return to your normal activities? Yes.    Do you have any questions about your discharge instructions: Diet   No. Medications  No. Follow up visit  No.  Do you have questions or concerns about your Care? No.  Actions: * If pain score is 4 or above: No action needed, pain <4.

## 2021-03-25 IMAGING — MR MR CERVICAL SPINE W/O CM
5 series · 36 of 48 positions shown · non-contrast
Comparison: 07/05/2012

CLINICAL DATA: Neck pain radiating to the head trauma shoulders and
left arm.

EXAM:
MRI CERVICAL SPINE WITHOUT CONTRAST
TECHNIQUE: Multiplanar, multisequence MR imaging of the cervical spine was
performed. No intravenous contrast was administered.

[Series 2: T2 · sagittal · 3.0mm · 0.41mm/px · 7 of 17 slices shown (1 of 2)]
[im 1/17]
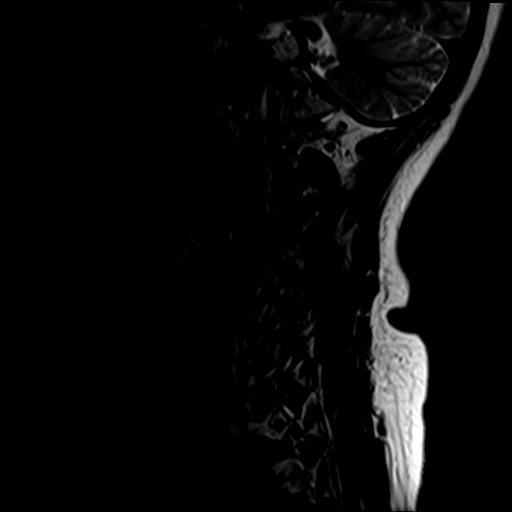
[im 3/17]
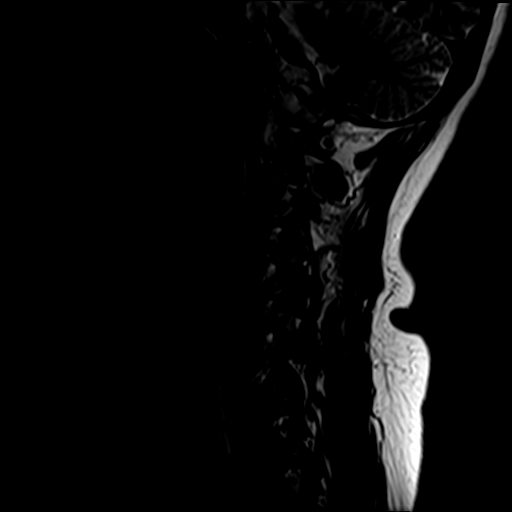
[im 6/17]
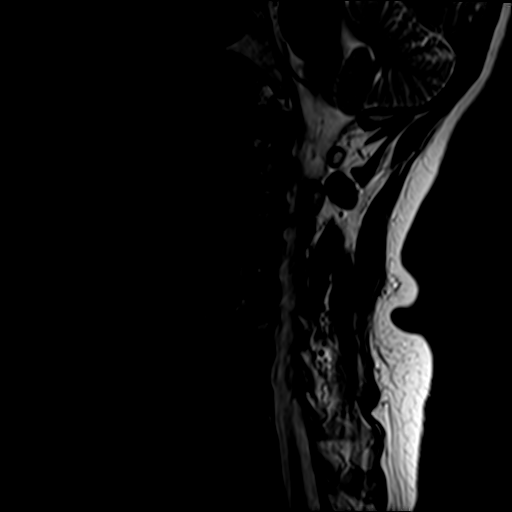
[im 9/17]
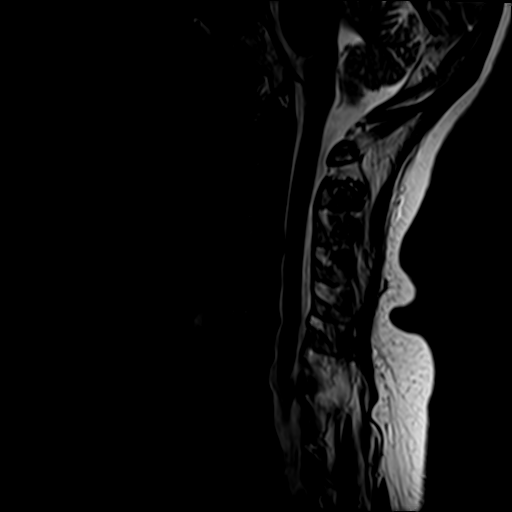
[im 11/17]
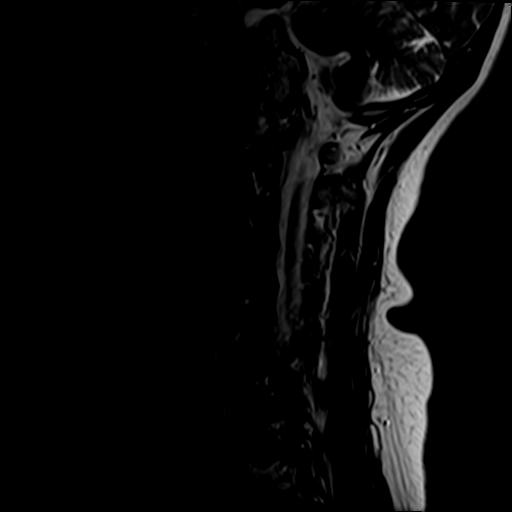
[im 14/17]
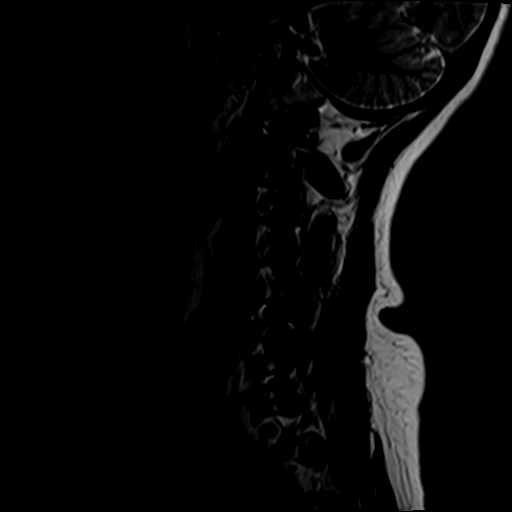
[im 17/17]
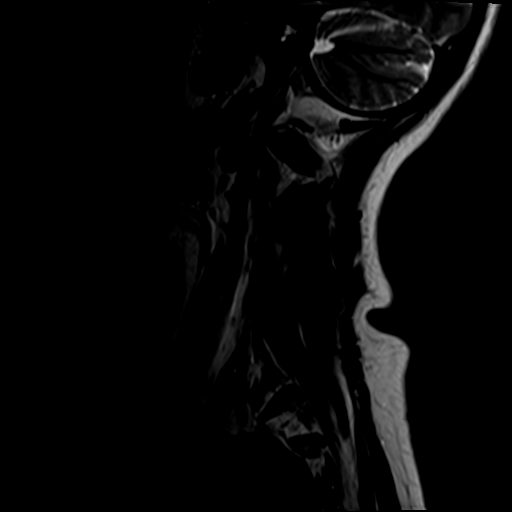

[Series 3: STIR · sagittal · 3.0mm · 0.82mm/px · 8 of 17 slices shown]
[im 1/17]
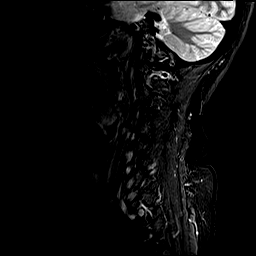
[im 3/17]
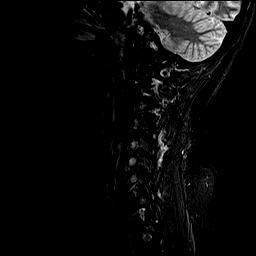
[im 5/17]
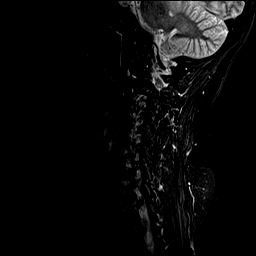
[im 7/17]
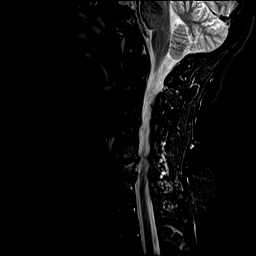
[im 10/17]
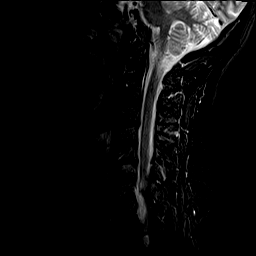
[im 12/17]
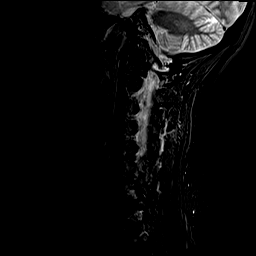
[im 14/17]
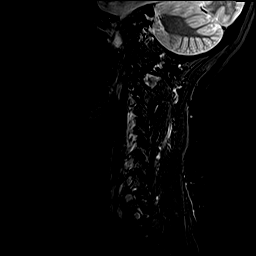
[im 17/17]
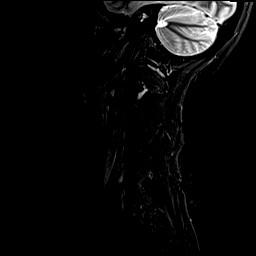

[Series 4: T1 · sagittal · 3.0mm · 0.82mm/px · 8 of 17 slices shown]
[im 1/17]
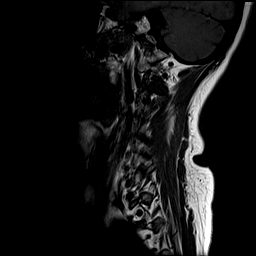
[im 3/17]
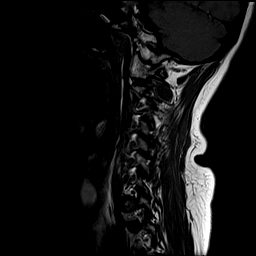
[im 5/17]
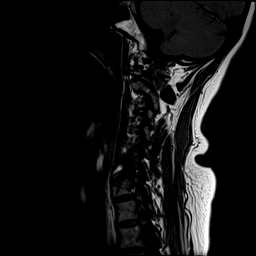
[im 7/17]
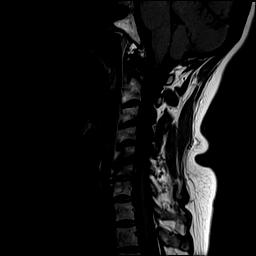
[im 10/17]
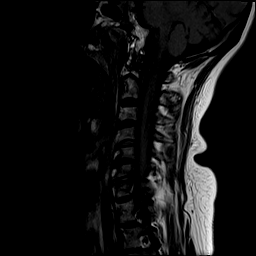
[im 12/17]
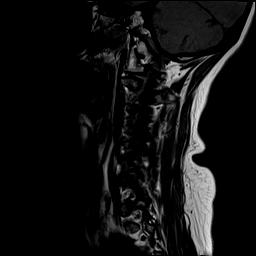
[im 14/17]
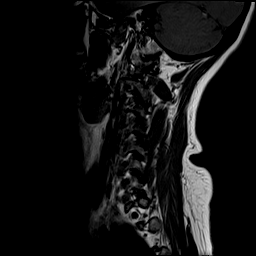
[im 17/17]
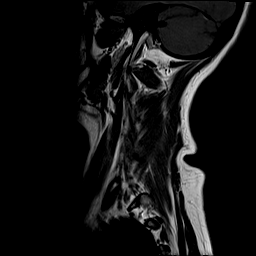

[Series 5: T2 · axial · 3.0mm · 0.70mm/px · z∈[-74,+27]mm · 9 of 28 slices shown (2 of 2)]
[im 1/28]
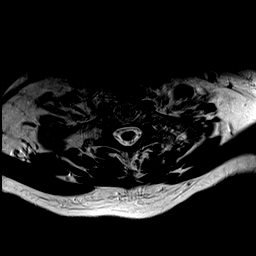
[im 5/28]
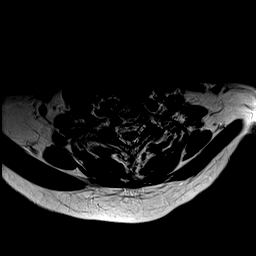
[im 10/28]
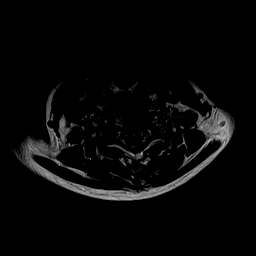
[im 12/28]
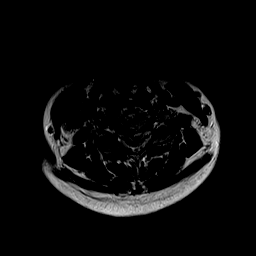
[im 14/28]
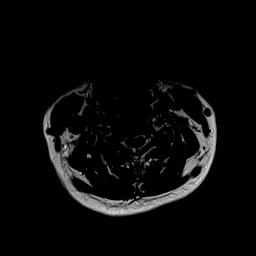
[im 16/28]
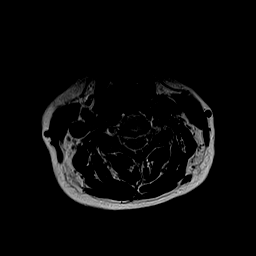
[im 19/28]
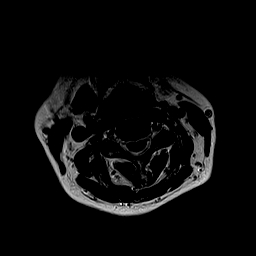
[im 23/28]
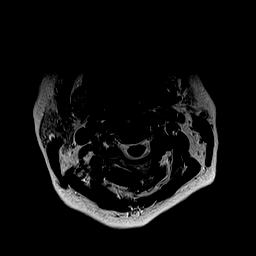
[im 28/28]
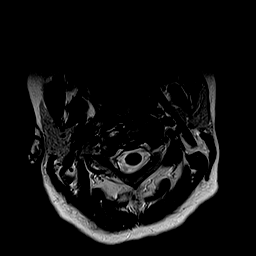

[Series 6: GRE · axial · 3.0mm · 0.35mm/px · z∈[-70,-29]mm · 4 of 26 slices shown]
[im 1/26]
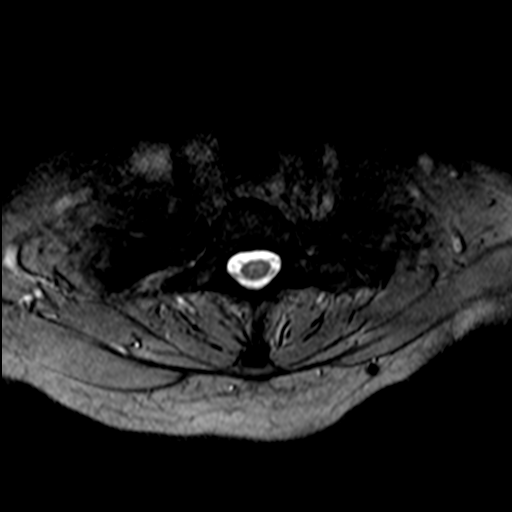
[im 5/26]
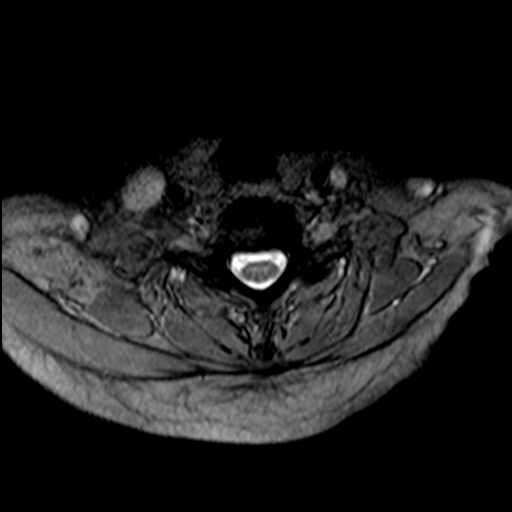
[im 7/26]
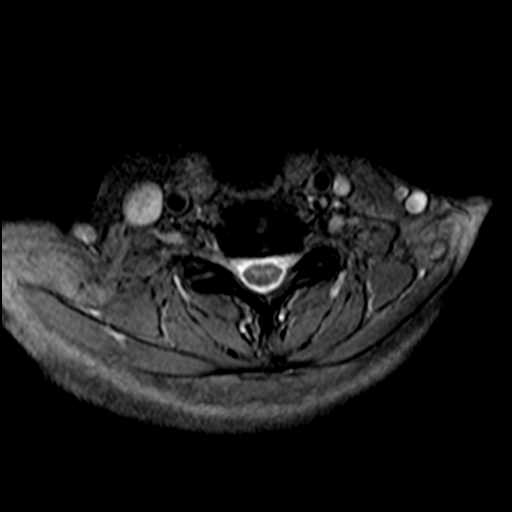
[im 12/26]
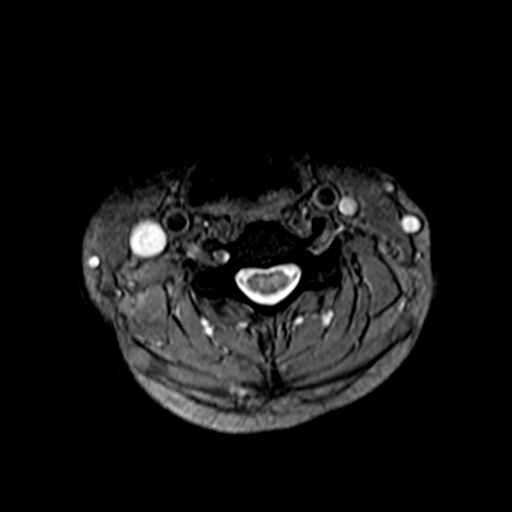

[36 of 48 positions shown; findings below may reference images not displayed]

FINDINGS: Alignment: Straightening of the normal cervical lordosis.

Vertebrae: No fracture or primary bone lesion. Small hemangioma
within anterior C7.

Cord: No cord compression or primary cord lesion.

Posterior Fossa, vertebral arteries, paraspinal tissues: Negative

Disc levels:

Foramen magnum widely patent. C1-2 is normal. Mild osteoarthritis of
the facet on the left at C2-3. No canal or foraminal stenosis.

C3-4: Spondylosis with small central disc protrusion. This indents
the thecal sac but does not affect the cord or show foraminal
extension.

C4-5: Minimal disc bulge.  No canal or foraminal stenosis.

C5-6: Endplate osteophytes and mild bulging of the disc. Slight
narrowing of the ventral subarachnoid space but no compressive
stenosis of the canal or foramina. Mild bony foraminal narrowing on
the right.

C6-7: Spondylosis with endplate osteophytes and bulging of the disc.
No central canal stenosis. Mild foraminal narrowing on the right and
moderate foraminal narrowing on the left. Some potential this could
affect in particular the left C7 nerve.

C7-T1: Mild facet hypertrophy. No disc pathology. No canal or
foraminal stenosis.
IMPRESSION: Degenerative spondylosis throughout the cervical region, newly seen
since 1592. No compressive central canal stenosis. Foraminal
narrowing that is most pronounced on the left at C6-7 where there is
some potential to affect in particular the left C7 nerve. Lesser
degenerative changes as outlined above could contribute to regional
neck pain.

## 2021-04-11 ENCOUNTER — Encounter: Payer: Self-pay | Admitting: Gastroenterology

## 2021-04-16 ENCOUNTER — Other Ambulatory Visit: Payer: Self-pay

## 2021-04-16 ENCOUNTER — Encounter: Payer: Self-pay | Admitting: Gastroenterology

## 2021-04-16 ENCOUNTER — Ambulatory Visit (AMBULATORY_SURGERY_CENTER): Payer: Medicare HMO | Admitting: Gastroenterology

## 2021-04-16 VITALS — BP 164/69 | HR 57 | Temp 98.0°F | Resp 19 | Ht 72.0 in | Wt 202.0 lb

## 2021-04-16 DIAGNOSIS — D125 Benign neoplasm of sigmoid colon: Secondary | ICD-10-CM

## 2021-04-16 DIAGNOSIS — Z86010 Personal history of colonic polyps: Secondary | ICD-10-CM

## 2021-04-16 DIAGNOSIS — R1032 Left lower quadrant pain: Secondary | ICD-10-CM

## 2021-04-16 DIAGNOSIS — D123 Benign neoplasm of transverse colon: Secondary | ICD-10-CM | POA: Diagnosis not present

## 2021-04-16 MED ORDER — SODIUM CHLORIDE 0.9 % IV SOLN: 500.0000 mL | Freq: Once | INTRAVENOUS | Status: DC

## 2021-04-16 NOTE — Progress Notes (Signed)
Called to room to assist during endoscopic procedure.  Patient ID and intended procedure confirmed with present staff. Received instructions for my participation in the procedure from the performing physician.  

## 2021-04-16 NOTE — Op Note (Signed)
[facility] ?Patient Name: Angel Avila ?Procedure Date: 04/16/2021 11:25 AM ?MRN: [id number] ?Endoscopist: Jackquline Denmark , MD ?Age: 56 ?Referring MD:  ?Date of Birth: 1965/08/09 ?Gender: Female ?Account #: [id number] ?Procedure:                Colonoscopy ?Indications:              High risk colon cancer surveillance: Personal  ?                          history of colonic polyps ?Medicines:                Monitored Anesthesia Care ?Procedure:                Pre-Anesthesia Assessment: ?                          - Prior to the procedure, a History and Physical  ?                          was performed, and patient medications and  ?                          allergies were reviewed. The patient's tolerance of  ?                          previous anesthesia was also reviewed. The risks  ?                          and benefits of the procedure and the sedation  ?                          options and risks were discussed with the patient.  ?                          All questions were answered, and informed consent  ?                          was obtained. Prior Anticoagulants: The patient has  ?                          taken no previous anticoagulant or antiplatelet  ?                          agents. ASA Grade Assessment: III - A patient with  ?                          severe systemic disease. After reviewing the risks  ?                          and benefits, the patient was deemed in  ?                          satisfactory condition to undergo the procedure. ?  After obtaining informed consent, the colonoscope  ?                          was passed under direct vision. Throughout the  ?                          procedure, the patient's blood pressure, pulse, and  ?                          oxygen saturations were monitored continuously. The  ?                          CF [id number] #[id number] was introduced through the anus  ?                          and advanced to the the cecum,  identified by  ?                          appendiceal orifice and ileocecal valve. The  ?                          colonoscopy was technically difficult and complex  ?                          due to a tortuous colon. Successful completion of  ?                          the procedure was aided by applying abdominal  ?                          pressure. The patient tolerated the procedure well.  ?                          The quality of the bowel preparation was adequate  ?                          to identify polyps. The ileocecal valve,  ?                          appendiceal orifice, and rectum were photographed. ?Scope In: 66:59:93 AM ?Scope Out: 12:25:09 PM ?Scope Withdrawal Time: 0 hours 10 minutes 34 seconds  ?Total Procedure Duration: 0 hours 38 minutes 12 seconds  ?Findings:                 Difficult colon due to highly torturous colon and  ?                          quality of prep despite 2-day prep. ?                          Three sessile polyps were found in the mid sigmoid  ?                          colon, mid transverse colon  and distal transverse  ?                          colon. The polyps were 4 to 6 mm in size. These  ?                          polyps were removed with a cold snare. Resection  ?                          and retrieval were complete. ?                          A few medium-mouthed diverticula were found in the  ?                          sigmoid colon and ascending colon. ?                          Non-bleeding internal hemorrhoids were found during  ?                          retroflexion. The hemorrhoids were medium-sized. ?                          The exam was otherwise without abnormality on  ?                          direct and retroflexion views. ?Complications:            No immediate complications. ?Estimated Blood Loss:     Estimated blood loss: none. ?Impression:               - Three 4 to 6 mm polyps in the mid sigmoid colon,  ?                          in the mid  transverse colon and in the distal  ?                          transverse colon, removed with a cold snare.  ?                          Resected and retrieved. ?                          - Mild colonic diverticulosis. ?                          - Non-bleeding internal hemorrhoids. ?                          - The examination was otherwise normal on direct  ?                          and retroflexion views. ?Recommendation:           - Patient has a contact number available for  ?  emergencies. The signs and symptoms of potential  ?                          delayed complications were discussed with the  ?                          patient. Return to normal activities tomorrow.  ?                          Written discharge instructions were provided to the  ?                          patient. ?                          - Resume previous diet. ?                          - Continue present medications. ?                          - Await pathology results. ?                          - Repeat colonoscopy in 3 years for surveillance  ?                          after 2-day prep. ?                          - The findings and recommendations were discussed  ?                          with the patient's family. ?Jackquline Denmark, MD ?04/16/2021 12:30:36 PM ?This report has been signed electronically. ?

## 2021-04-16 NOTE — Progress Notes (Signed)
PT taken to PACU. Monitors in place. VSS. Report given to RN. 

## 2021-04-16 NOTE — Progress Notes (Signed)
? ? ?Chief Complaint: For GI eval ? ?Referring Provider:  Jackquline Denmark, MD    ? ? ?ASSESSMENT AND PLAN;  ? ?#1. H/O polyps ? ?#2.  Intermittent constipation with LLQ pain (resolved) ? ?#3. GERD with small HH ? ?Plan: ?-Colon for further eval ?-Omeprazole '20mg'$  po QD to continue. Can try 1/2 before dinner ?-Continue Metamucil with 8oz water QD. ?-If with any further problems, would consider CT Abdo/pelvis with contrast. ? ? ?Discussed risks & benefits of colonoscopy. Risks including rare perforation req laparotomy, bleeding after bx/polypectomy req blood transfusion, rarely missing neoplasms, risks of anesthesia/sedation, rare risk of damage to internal organs. Benefits outweigh the risks. Patient agrees to proceed. All the questions were answered. Pt consents to proceed. ?HPI:   ? ?Angel Avila is a 56 y.o. female  ?Sjogren's syndrome with inflammatory arthritis/pulm nodules/neuropathy on imuran/Plaquenil/oxycodone ? ?No significant GI complaints. ? ?She does have morning nausea which she had for many years.  Likely related to polypharmacy including narcotics. ? ?Occasional constipation with LLQ discomfort which gets better with BMs.  She has been taking Metamucil with plenty of water every day with resultant improvement. ? ?No recent weight loss or loss of appetite.  No fever chills or night sweats.  No jaundice dark urine or pale stools. ? ?Here for colonoscopy. ? ? ? ?Past GI work-up: ? ?EGD 09/10/2016 ?-Mild gastritis ?-Small hiatal hernia. ?-Neg SB bx for celiac ? ?Colonoscopy 11/24/2016: ?-1 cm polyp s/p polypectomy. Bx-tubular adenoma ?-Repeat in 3 years ? ? ?CT chest 05/2020 ?1. No evidence for acute pulmonary embolus. ?2. 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 ?3. Stable to improved appearance of scattered, less than 5 mm  lung ?nodules. As mentioned on the previous exam, in this high risk ?patient the remaining tiny nodules can be followed up with ?non-contrast chest CT in 12 months. This recommendation follows the ?consensus ?statement: Guidelines for Management of Incidental Pulmonary Nodules ?Detected on CT images: From the Fleischner Society 2017; Radiology ?[id number]; [id number]. ?4. Nonobstructing right renal calculus. ? ?Aortic Atherosclerosis (ICD10-I70.0).  ?Past Medical History:  ?Diagnosis Date  ? CAD (coronary artery disease), native coronary artery 03/02/2019  ? COPD (chronic obstructive pulmonary disease) ([place]) 02/22/2019  ? History of nephrolithiasis 03/02/2019  ? Inflammatory arthritis 07/14/2014  ? Laryngopharyngeal reflux 02/22/2019  ? Obstructive sleep apnea on CPAP   ? Palpitations 03/03/2017  ? Pulmonary nodules 12/26/2020  ? careeverywhere  ? Restless leg syndrome   ? Sjogren's syndrome ([place]) 01/19/2017  ? Spondylolisthesis of lumbar region 02/17/2017  ? SVT (supraventricular tachycardia) ([place])   ? TACS (trigeminal autonomic cephalgias)   ? Trigeminal neuralgia   ? 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 ? ?Past Surgical History:  ?Procedure Laterality Date  ? BACK SURGERY    ? BRONCHOSCOPY    ? COLONOSCOPY    ? orif left elbow    ? SALIVARY STONE REMOVAL    ? TONSILLECTOMY    ? TOTAL ABDOMINAL HYSTERECTOMY    ? ? ?Family History  ?Problem Relation Age of Onset  ? Heart disease Mother   ? Breast cancer Mother   ? Coronary artery disease Mother   ? Hypertension Mother   ? Lung disease Mother   ? Lupus Mother   ? Thyroid disease Mother   ? Liver cancer Mother   ? Arthritis/Rheumatoid Father   ?  Lupus Sister   ? Diabetes Sister   ? Breast cancer Sister   ? Diabetes Brother   ? Crohn's disease Brother   ? Autoimmune disease Brother   ?     hyperhydrosis  ? Glaucoma Maternal Grandmother   ? Macular degeneration Maternal Grandmother   ? Uterine cancer Paternal Grandmother   ? Colon cancer  Paternal Grandmother   ? Autoimmune disease Daughter   ?     hyperhydrosis  ? Stomach cancer Neg Hx   ? Rectal cancer Neg Hx   ? ? ?Social History  ? ?Tobacco Use  ? Smoking status: Former  ?  Types: Cigarettes  ? Smokeless tobacco: Never  ?Vaping Use  ? Vaping Use: Never used  ?Substance Use Topics  ? Alcohol use: Not Currently  ? Drug use: Not Currently  ? ? ?Current Outpatient Medications  ?Medication Sig Dispense Refill  ? albuterol (VENTOLIN HFA) 108 (90 Base) MCG/ACT inhaler Inhale 2 puffs into the lungs every 6 (six) hours.    ? azaTHIOprine (IMURAN) 50 MG tablet Take 1 tablet by mouth in the morning and at bedtime.    ? budesonide (PULMICORT) 1 MG/2ML nebulizer solution Inhale 1 mg into the lungs in the morning and at bedtime.    ? carvedilol (COREG) 12.5 MG tablet Take 12.5 mg by mouth daily.    ? Cholecalciferol 50 MCG (2000 UT) TABS Take 50 mcg by mouth daily.    ? ezetimibe (ZETIA) 10 MG tablet Take 1 tablet by mouth daily.    ? FLUoxetine (PROZAC) 40 MG capsule Take 1 capsule by mouth 2 (two) times daily.    ? fluticasone (FLONASE) 50 MCG/ACT nasal spray Place 1 spray into both nostrils daily.    ? Fluticasone-Umeclidin-Vilant (TRELEGY ELLIPTA) 200-62.5-25 MCG/ACT AEPB Inhale 1 puff into the lungs daily.    ? folic acid (FOLVITE) 1 MG tablet Take 1 mg by mouth 2 (two) times daily.    ? hydroxychloroquine (PLAQUENIL) 200 MG tablet Take 2 tablets by mouth daily.    ? ipratropium-albuterol (DUONEB) 0.5-2.5 (3) MG/3ML SOLN Inhale 3 mLs into the lungs every 6 (six) hours.    ? montelukast (SINGULAIR) 10 MG tablet Take 1 tablet by mouth at bedtime.    ? omeprazole (PRILOSEC) 20 MG capsule Take 1 capsule (20 mg total) by mouth daily. 30 capsule 11  ? oxcarbazepine (TRILEPTAL) 600 MG tablet Take by mouth.    ? Oxycodone HCl 10 MG TABS Take 1 tablet by mouth 2 (two) times daily as needed.    ? pilocarpine (SALAGEN) 5 MG tablet Take 1 tablet by mouth 3 (three) times daily.    ? POLYETHYLENE GLYCOL 400 OP Apply 1  drop to eye 4 (four) times daily.    ? predniSONE (DELTASONE) 5 MG tablet Take 1 tablet by mouth daily.    ? rosuvastatin (CRESTOR) 40 MG tablet Take 1 tablet by mouth daily.    ? solifenacin (VESICARE) 5 MG tablet Take 5 mg by mouth every evening.    ? tamsulosin (FLOMAX) 0.4 MG CAPS capsule Take 0.4 mg by mouth at bedtime.    ? Evolocumab (REPATHA SURECLICK) 591 MG/ML SOAJ Inject 140 mg into the skin every 14 (fourteen) days.    ? nabumetone (RELAFEN) 500 MG tablet Take by mouth.    ? solifenacin (VESICARE) 10 MG tablet Take by mouth.    ? TRELEGY ELLIPTA 100-62.5-25 MCG/ACT AEPB Inhale into the lungs.    ? ?Current Facility-Administered Medications  ?Medication Dose  Route Frequency Provider Last Rate Last Admin  ? 0.9 %  sodium chloride infusion  500 mL Intravenous Once Jackquline Denmark, MD      ? ? ?Allergies  ?Allergen Reactions  ? Sulfa Antibiotics   ? ? ?Review of Systems:  ?Constitutional: Denies fever, chills, diaphoresis, appetite change and has fatigue.  ?HEENT: Denies photophobia, eye pain, redness, hearing loss, ear pain, congestion, sore throat, rhinorrhea, sneezing, mouth sores, neck pain, neck stiffness and tinnitus.   ?Respiratory: Denies SOB, DOE, cough, chest tightness,  and wheezing.   ?Cardiovascular: Denies chest pain, palpitations and leg swelling.  ?Genitourinary: Denies dysuria, urgency, frequency, hematuria, flank pain and difficulty urinating.  ?Musculoskeletal: Has myalgias, back pain, joint swelling, arthralgias and gait problem.  ?Skin: No rash.  ?Neurological: Denies dizziness, seizures, syncope, weakness, light-headedness, numbness and headaches.  ?Hematological: Denies adenopathy. Easy bruising, personal or family bleeding history  ?Psychiatric/Behavioral: Has anxiety or depression ? ?  ? ?Physical Exam:   ? ?BP (!) 142/76   Pulse 68   Temp 98 ?F (36.7 ?C) (Temporal)   Ht 6' (1.829 m)   Wt 202 lb (91.6 kg)   SpO2 98%   BMI 27.40 kg/m?  ?Wt Readings from Last 3 Encounters:   ?04/16/21 202 lb (91.6 kg)  ?03/06/21 202 lb (91.6 kg)  ?02/27/21 202 lb 4 oz (91.7 kg)  ? ?Constitutional:  Well-developed, in no acute distress. ?Psychiatric: Normal mood and affect. Behavior is normal. ?HEENT: Pupils n

## 2021-04-16 NOTE — Patient Instructions (Signed)
Handouts given on hemorrhoids, diverticulosis, and polyps. ?Await pathology results. ?Resume previous diet and continue present medications. ?Repeat colonoscopy in 3 years after 2 day prep for surveillance. ? ? ?YOU HAD AN ENDOSCOPIC PROCEDURE TODAY AT [facility]:   Refer to the procedure report that was given to you for any specific questions about what was found during the examination.  If the procedure report does not answer your questions, please call your gastroenterologist to clarify.  If you requested that your care partner not be given the details of your procedure findings, then the procedure report has been included in a sealed envelope for you to review at your convenience later. ? ?YOU SHOULD EXPECT: Some feelings of bloating in the abdomen. Passage of more gas than usual.  Walking can help get rid of the air that was put into your GI tract during the procedure and reduce the bloating. If you had a lower endoscopy (such as a colonoscopy or flexible sigmoidoscopy) you may notice spotting of blood in your stool or on the toilet paper. If you underwent a bowel prep for your procedure, you may not have a normal bowel movement for a few days. ? ?Please Note:  You might notice some irritation and congestion in your nose or some drainage.  This is from the oxygen used during your procedure.  There is no need for concern and it should clear up in a day or so. ? ?SYMPTOMS TO REPORT IMMEDIATELY: ? ?Following lower endoscopy (colonoscopy or flexible sigmoidoscopy): ? Excessive amounts of blood in the stool ? Significant tenderness or worsening of abdominal pains ? Swelling of the abdomen that is new, acute ? Fever of 100?F or higher ? ?For urgent or emergent issues, a gastroenterologist can be reached at any hour by calling [phone]. ?Do not use MyChart messaging for urgent concerns.  ? ? ?DIET:  We do recommend a small meal at first, but then you may proceed to your regular diet.  Drink  plenty of fluids but you should avoid alcoholic beverages for 24 hours. ? ?ACTIVITY:  You should plan to take it easy for the rest of today and you should NOT DRIVE or use heavy machinery until tomorrow (because of the sedation medicines used during the test).   ? ?FOLLOW UP: ?Our staff will call the number listed on your records 48-72 hours following your procedure to check on you and address any questions or concerns that you may have regarding the information given to you following your procedure. If we do not reach you, we will leave a message.  We will attempt to reach you two times.  During this call, we will ask if you have developed any symptoms of COVID 19. If you develop any symptoms (ie: fever, flu-like symptoms, shortness of breath, cough etc.) before then, please call [phone].  If you test positive for Covid 19 in the 2 weeks post procedure, please call and report this information to [place].   ? ?If any biopsies were taken you will be contacted by phone or by letter within the next 1-3 weeks.  Please call us at [phone] if you have not heard about the biopsies in 3 weeks.  ? ? ?SIGNATURES/CONFIDENTIALITY: ?You and/or your care partner have signed paperwork which will be entered into your electronic medical record.  These signatures attest to the fact that that the information above on your After Visit Summary has been reviewed and is understood.  Full responsibility of the confidentiality  of this discharge information lies with you and/or your care-partner.  ?

## 2021-04-16 NOTE — Progress Notes (Signed)
Pt's states no medical or surgical changes since previsit or office visit.  Vitals DT 

## 2021-04-18 ENCOUNTER — Telehealth: Payer: Self-pay

## 2021-04-18 NOTE — Telephone Encounter (Signed)
?  Follow up Call- ? ? ?  04/16/2021  ? 11:09 AM 03/06/2021  ?  1:11 PM  ?Call back number  ?Post procedure Call Back phone  # [phone]  ?Permission to leave phone message Yes Yes  ?  ? ?Patient questions: ? ?Do you have a fever, pain , or abdominal swelling? No. ?Pain Score  0 * ? ?Have you tolerated food without any problems? Yes.   ? ?Have you been able to return to your normal activities? Yes.   ? ?Do you have any questions about your discharge instructions: ?Diet   No. ?Medications  No. ?Follow up visit  No. ? ?Do you have questions or concerns about your Care? No. ? ?Actions: ?* If pain score is 4 or above: ?No action needed, pain <4. ? ? ?

## 2021-04-28 ENCOUNTER — Encounter: Payer: Self-pay | Admitting: Gastroenterology

## 2021-10-11 ENCOUNTER — Other Ambulatory Visit: Payer: Self-pay | Admitting: Nurse Practitioner

## 2021-10-11 DIAGNOSIS — R928 Other abnormal and inconclusive findings on diagnostic imaging of breast: Secondary | ICD-10-CM

## 2021-10-25 ENCOUNTER — Ambulatory Visit
Admission: RE | Admit: 2021-10-25 | Discharge: 2021-10-25 | Disposition: A | Discharge status: Home / Self Care | Payer: Medicare HMO | Source: Ambulatory Visit | Attending: Nurse Practitioner | Admitting: Nurse Practitioner

## 2021-10-25 ENCOUNTER — Other Ambulatory Visit: Payer: Self-pay | Admitting: Nurse Practitioner

## 2021-10-25 DIAGNOSIS — R928 Other abnormal and inconclusive findings on diagnostic imaging of breast: Secondary | ICD-10-CM

## 2022-03-21 ENCOUNTER — Other Ambulatory Visit: Payer: Self-pay | Admitting: Gastroenterology

## 2022-05-07 ENCOUNTER — Other Ambulatory Visit: Payer: Self-pay | Admitting: Gastroenterology

## 2022-05-09 ENCOUNTER — Other Ambulatory Visit: Payer: Self-pay

## 2022-05-09 MED ORDER — OMEPRAZOLE 20 MG PO CPDR: 20.0000 mg | DELAYED_RELEASE_CAPSULE | Freq: Every day | ORAL | 1 refills | Status: DC

## 2022-10-01 ENCOUNTER — Other Ambulatory Visit: Payer: Self-pay | Admitting: Gastroenterology

## 2022-12-13 ENCOUNTER — Other Ambulatory Visit: Payer: Self-pay | Admitting: Gastroenterology

## 2023-04-14 ENCOUNTER — Ambulatory Visit: Payer: Medicare HMO | Admitting: Gastroenterology

## 2023-05-11 ENCOUNTER — Other Ambulatory Visit: Payer: Self-pay | Admitting: Gastroenterology

## 2023-05-18 ENCOUNTER — Encounter: Payer: Self-pay | Admitting: Gastroenterology

## 2023-05-18 ENCOUNTER — Ambulatory Visit: Admitting: Gastroenterology

## 2023-05-18 VITALS — BP 118/78 | HR 56 | Ht 72.0 in | Wt 185.8 lb

## 2023-05-18 DIAGNOSIS — K5904 Chronic idiopathic constipation: Secondary | ICD-10-CM

## 2023-05-18 DIAGNOSIS — K219 Gastro-esophageal reflux disease without esophagitis: Secondary | ICD-10-CM | POA: Diagnosis not present

## 2023-05-18 DIAGNOSIS — R140 Abdominal distension (gaseous): Secondary | ICD-10-CM | POA: Diagnosis not present

## 2023-05-18 DIAGNOSIS — R1032 Left lower quadrant pain: Secondary | ICD-10-CM

## 2023-05-18 MED ORDER — FAMOTIDINE 20 MG PO TABS: 20.0000 mg | ORAL_TABLET | Freq: Every day | ORAL | 0 refills | Status: DC

## 2023-05-18 MED ORDER — OMEPRAZOLE 40 MG PO CPDR: 40.0000 mg | DELAYED_RELEASE_CAPSULE | Freq: Every day | ORAL | 0 refills | Status: DC

## 2023-05-18 NOTE — Patient Instructions (Addendum)
 Increase Omeprazole  from 20 mg to 40 mg po daily 1 tablet 30-45 minutes before breakfast Add Pepcid 20 mg at bedtime (can purchase over the counter until you get script) Strict GERD diet, no late meals 3-4 hours before lying down.    Provided Samples of Linzess 72 mcg po daily, take 1 tablet 30-45 minutes before first meal of the day with full glass of water. May have loose stools the first few days.   Please message me via Mychart in a few weeks if the following medications are working.  _______________________________________________________  If your blood pressure at your visit was 140/90 or greater, please contact your primary care physician to follow up on this.  _______________________________________________________  If you are age 21 or older, your body mass index should be between 23-30. Your Body mass index is 25.2 kg/m. If this is out of the aforementioned range listed, please consider follow up with your Primary Care Provider.  If you are age 4 or younger, your body mass index should be between 19-25. Your Body mass index is 25.2 kg/m. If this is out of the aformentioned range listed, please consider follow up with your Primary Care Provider.   ________________________________________________________  The [facility] providers would like to encourage you to use MYCHART to communicate with providers for non-urgent requests or questions.  Due to long hold times on the telephone, sending your provider a message by [facility] may be a faster and more efficient way to get a response.  Please allow 48 business hours for a response.  Please remember that this is for non-urgent requests.  _______________________________________________________ Thank you for trusting me with your gastrointestinal care!   Dyanna Glasgow, RNP

## 2023-05-18 NOTE — Progress Notes (Signed)
 Chief Complaint:follow-up GERD, constipation Primary GI Doctor:Dr. Venice Gillis  HPI: Angel Avila is a 58 y.o. female  Sjogren's syndrome with inflammatory arthritis/pulm nodules/neuropathy on imuran/Plaquenil/oxycodone Last seen by Dr. Venice Gillis on 04/16/21. No significant GI complaints.   She does have morning nausea which she had for many years.  Likely related to polypharmacy including narcotics.   Occasional constipation with LLQ discomfort which gets better with BMs.  She has been taking Metamucil with plenty of water every day with resultant improvement.   No recent weight loss or loss of appetite.  No fever chills or night sweats.  No jaundice dark urine or pale stools.  Past GI work-up:   EGD 09/10/2016 -Mild gastritis -Small hiatal hernia. -Neg SB bx for celiac   Colonoscopy 11/24/2016: -1 cm polyp s/p polypectomy. Bx-tubular adenoma -Repeat in 3 years  Colonoscopy 03/06/21 - prep was unsatisfactory.   Colonoscopy 04/16/21 - three 4-6 mm polyps in mid sigmoid colon, mid transverse, and distal transverse, removed. Mild colonic diverticulosis. Non-bleeding internal hemorrhoids. Path: Surgical [P], colon, sigmoid and transverse, polyp (3) - TUBULAR ADENOMA (1) WITHOUT HIGH GRADE DYSPLASIA. - HYPERPLASTIC POLYP (1) - COLONIC MUCOSA WITH BENIGN LYMPHOID AGGREGATE (1).   CT chest 05/2020 1. No evidence for acute pulmonary embolus. 2. 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 3. Stable to improved appearance of scattered, less than 5 mm lung nodules. As mentioned on the previous exam, in this high risk patient the remaining tiny nodules can be followed up with non-contrast chest CT in 12 months. This recommendation follows the consensus statement: Guidelines for Management of Incidental Pulmonary  Nodules Detected on CT images: From the Fleischner Society 2017; Radiology 017; [phone]. 4. Nonobstructing right renal calculus.  Aortic Atherosclerosis (ICD10-I70.0).   Interval History    Patient presents for evaluation of chronic constipation and uncontrolled GERD. Patient has history of chronic constipation. She states she has bowel movement 1-2 times a day. She states it is a small amount. She uses Magnesium citrate prn.  Patient states she has tried high-fiber diet and it makes her feel worse. She has intermittent LLQ abdominal pain that occurs 1-2 times a month. She states she has had this several years. She also has a lot of gas and bloat. No blood in stool.  Patient also complains of uncontrolled GERD on Prilosec 20 mg po daily. She has a lot of pyrosis and burping. Patient denies dysphagia. Patient regurgitates food at times.  Good appetite. Weight stable. She states she feels full quickly.   Wt Readings from Last 3 Encounters:  05/18/23 185 lb 12.8 oz (84.3 kg)  04/16/21 202 lb (91.6 kg)  03/06/21 202 lb (91.6 kg)    Past Medical History:  Diagnosis Date   CAD (coronary artery disease), native coronary artery 03/02/2019   COPD (chronic obstructive pulmonary disease) (HCC) 02/22/2019   History of nephrolithiasis 03/02/2019   Inflammatory arthritis 07/14/2014   Laryngopharyngeal reflux 02/22/2019   Obstructive sleep apnea on CPAP    Palpitations 03/03/2017   Pulmonary nodules 12/26/2020   careeverywhere   Restless leg syndrome    Sjogren's syndrome (HCC) 01/19/2017   Spondylolisthesis of lumbar region 02/17/2017   SVT (supraventricular tachycardia) (HCC)    TACS (trigeminal autonomic cephalgias)    Trigeminal neuralgia    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     budesonide (PULMICORT) 1 MG/2ML nebulizer solution Inhale 1 mg into the lungs in the morning and at bedtime.     carvedilol (COREG) 12.5 MG tablet Take 12.5 mg by mouth daily.     Cholecalciferol 50 MCG (2000 UT) TABS Take 50 mcg by mouth daily.     Evolocumab (REPATHA SURECLICK) 140 MG/ML SOAJ Inject 140 mg into the skin every 14 (fourteen) days.     ezetimibe (ZETIA) 10 MG tablet Take 1 tablet by mouth daily.     FLUoxetine (PROZAC) 40 MG capsule Take 1 capsule by mouth 2 (two) times daily.     fluticasone (FLONASE) 50 MCG/ACT nasal spray Place 1 spray into both nostrils daily.     Fluticasone-Umeclidin-Vilant (TRELEGY ELLIPTA) 200-62.5-25 MCG/ACT AEPB Inhale 1 puff into the lungs daily.     folic acid (FOLVITE) 1 MG tablet Take 1 mg by mouth 2 (two) times daily.     hydroxychloroquine (PLAQUENIL) 200 MG tablet Take 2 tablets by mouth daily.     ipratropium-albuterol  (DUONEB) 0.5-2.5 (3) MG/3ML SOLN Inhale 3 mLs into the lungs every 6 (six) hours.     montelukast (SINGULAIR) 10 MG tablet Take 1 tablet by mouth at bedtime.     nabumetone (RELAFEN) 500 MG tablet Take by mouth.     omeprazole  (PRILOSEC) 20 MG capsule TAKE 1 CAPSULE EVERY DAY (PLEASE CALL [id number] TO SCHEDULE AN OFFICE VISIT FOR MORE REFILLS) 90 capsule 0   Oxycodone HCl 10 MG TABS Take 1 tablet by mouth 2 (two) times daily as needed.     pilocarpine (SALAGEN) 5 MG tablet Take 1 tablet by mouth 3 (three) times daily.     POLYETHYLENE GLYCOL 400 OP Apply 1 drop to eye 4 (four) times daily.     predniSONE (DELTASONE) 5 MG tablet Take 1 tablet by mouth daily.     rosuvastatin (CRESTOR) 40 MG tablet Take 1 tablet by mouth daily.     solifenacin (VESICARE) 10 MG tablet Take by mouth.     solifenacin (VESICARE) 5 MG tablet Take 5 mg by  mouth every evening.     tamsulosin (FLOMAX) 0.4 MG CAPS capsule Take 0.4 mg by mouth at bedtime.     TRELEGY ELLIPTA 100-62.5-25 MCG/ACT AEPB Inhale into the lungs.     albuterol  (VENTOLIN  HFA) 108 (90 Base) MCG/ACT inhaler Inhale 2 puffs into the lungs every 6 (six) hours.     oxcarbazepine (TRILEPTAL) 600 MG tablet Take by mouth. (Patient not taking: Reported on 05/18/2023)     No current facility-administered medications for this visit.    Allergies as of 05/18/2023 - Review Complete 05/18/2023  Allergen Reaction Noted   Sulfa antibiotics  03/29/2018    Family History  Problem Relation Age of Onset   Heart disease Mother    Breast cancer Mother    Coronary artery disease Mother    Hypertension Mother    Lung disease Mother    Lupus Mother    Thyroid  disease Mother    Liver cancer Mother    Arthritis/Rheumatoid Father  Lupus Sister    Diabetes Sister    Breast cancer Sister    Diabetes Brother    Crohn's disease Brother    Autoimmune disease Brother        hyperhydrosis   Glaucoma Maternal Grandmother    Macular degeneration Maternal Grandmother    Uterine cancer Paternal Grandmother    Colon cancer Paternal Grandmother    Autoimmune disease Daughter        hyperhydrosis   Stomach cancer Neg Hx    Rectal cancer Neg Hx     Review of Systems:    Constitutional: No weight loss, fever, chills, weakness or fatigue HEENT: Eyes: No change in vision               Ears, Nose, Throat:  No change in hearing or congestion Skin: No rash or itching Cardiovascular: No chest pain, chest pressure or palpitations   Respiratory: No SOB or cough Gastrointestinal: See HPI and otherwise negative Genitourinary: No dysuria or change in urinary frequency Neurological: No headache, dizziness or syncope Musculoskeletal: No new muscle or joint pain Hematologic: No bleeding or bruising Psychiatric: No history of depression or anxiety    Physical Exam:  Vital signs: BP 118/78    Pulse (!) 56   Ht 6' (1.829 m)   Wt 185 lb 12.8 oz (84.3 kg)   BMI 25.20 kg/m   Constitutional:   Pleasant  female appears to be in NAD, Well developed, Well nourished, alert and cooperative Throat: Oral cavity and pharynx without inflammation, swelling or lesion.  Respiratory: Respirations even and unlabored. Lungs clear to auscultation bilaterally.   No wheezes, crackles, or rhonchi.  Cardiovascular: Normal S1, S2. Regular rate and rhythm. No peripheral edema, cyanosis or pallor.  Gastrointestinal:  Soft, nondistended, nontender. No rebound or guarding. Normal bowel sounds. No appreciable masses or hepatomegaly. Rectal:  Not performed.  Msk:  Symmetrical without gross deformities. Without edema, no deformity or joint abnormality.  Neurologic:  Alert and  oriented x4;  grossly normal neurologically.  Skin:   Dry and intact without significant lesions or rashes. Psychiatric: Oriented to person, place and time. Demonstrates good judgement and reason without abnormal affect or behaviors.  RELEVANT LABS AND IMAGING: 02/25/2023 XR Abd- Visualization is limited by constipation.   Assessment: Encounter Diagnoses  Name Primary?   Gastroesophageal reflux disease, unspecified whether esophagitis present Yes   Abdominal pain, LLQ    Chronic idiopathic constipation    Bloating      58 year old female patient with uncontrolled GERD on Prilosec 20 mg p.o. daily.  Initially I will go ahead and increase the omeprazole  from 20 mg to 40 mg po daily and add Pepcid 20 mg at bedtime.  If no improvement Chicquita Mendel consider switching to new PPI therapy.    For the constipation not relieved with over-the-counter laxatives or high-fiber diet will give her samples of pro secretory agent Linzess 72 mcg p.o. daily  Plan: - Increase Omeprazole  from 20 mg to 40 mg po daily -Add Pepcid 20 mg at bedtime -GERD diet, no late meals 3-4 hours before lying down. -Samples of Linzess 72 mcg po daily -recall colonoscopy  03/2024  Thank you for the courtesy of this consult. Please call me with any questions or concerns.   Angel Clute, FNP-C [facility] 05/18/2023, 3:03 PM  Cc: No ref. provider found

## 2023-05-26 NOTE — Telephone Encounter (Signed)
 Please see note from pt below   Last seen in office on 05/18/2023 with [facility] May NP. Deanna May NP out of office  Please review and advise

## 2023-06-02 NOTE — Telephone Encounter (Signed)
 PT is calling for an update on a higher dosage of Linzess please advise.

## 2023-06-04 MED ORDER — LINACLOTIDE 145 MCG PO CAPS: 145.0000 ug | ORAL_CAPSULE | Freq: Every day | ORAL | Status: DC

## 2023-07-02 ENCOUNTER — Other Ambulatory Visit: Payer: Self-pay

## 2023-07-02 MED ORDER — LINACLOTIDE 145 MCG PO CAPS: 145.0000 ug | ORAL_CAPSULE | Freq: Every day | ORAL | 3 refills | Status: DC

## 2023-07-27 ENCOUNTER — Other Ambulatory Visit: Payer: Self-pay | Admitting: Gastroenterology

## 2023-07-27 DIAGNOSIS — K219 Gastro-esophageal reflux disease without esophagitis: Secondary | ICD-10-CM

## 2023-10-12 ENCOUNTER — Encounter: Payer: Self-pay | Admitting: Gastroenterology

## 2023-10-12 ENCOUNTER — Ambulatory Visit (INDEPENDENT_AMBULATORY_CARE_PROVIDER_SITE_OTHER): Admitting: Gastroenterology

## 2023-10-12 VITALS — BP 104/60 | HR 64 | Ht 69.5 in | Wt 188.1 lb

## 2023-10-12 DIAGNOSIS — Z860101 Personal history of adenomatous and serrated colon polyps: Secondary | ICD-10-CM | POA: Diagnosis not present

## 2023-10-12 DIAGNOSIS — K5904 Chronic idiopathic constipation: Secondary | ICD-10-CM | POA: Diagnosis not present

## 2023-10-12 DIAGNOSIS — K219 Gastro-esophageal reflux disease without esophagitis: Secondary | ICD-10-CM | POA: Diagnosis not present

## 2023-10-12 MED ORDER — LINACLOTIDE 145 MCG PO CAPS: 145.0000 ug | ORAL_CAPSULE | Freq: Every day | ORAL | 3 refills | Status: DC

## 2023-10-12 MED ORDER — LINACLOTIDE 145 MCG PO CAPS: 145.0000 ug | ORAL_CAPSULE | Freq: Every day | ORAL | 3 refills | Status: AC

## 2023-10-12 MED ORDER — OMEPRAZOLE 40 MG PO CPDR: 40.0000 mg | DELAYED_RELEASE_CAPSULE | Freq: Every day | ORAL | 3 refills | Status: AC

## 2023-10-12 MED ORDER — FAMOTIDINE 20 MG PO TABS: 20.0000 mg | ORAL_TABLET | Freq: Two times a day (BID) | ORAL | 3 refills | Status: AC

## 2023-10-12 NOTE — Progress Notes (Signed)
 Chief Complaint:follow-up GERD, constipation Primary GI Doctor:Dr. Charlanne  HPI: Angel Avila is a 58 y.o. female  Sjogren's syndrome with inflammatory arthritis/pulm nodules/neuropathy on imuran/Plaquenil/oxycodone Last seen by Dr. Charlanne on 04/16/21. No significant GI complaints.   She does have morning nausea which she had for many years.  Likely related to polypharmacy including narcotics.   Occasional constipation with LLQ discomfort which gets better with BMs.  She has been taking Metamucil with plenty of water every day with resultant improvement.   No recent weight loss or loss of appetite.  No fever chills or night sweats.  No jaundice dark urine or pale stools.  Past GI work-up:   EGD 09/10/2016 -Mild gastritis -Small hiatal hernia. -Neg SB bx for celiac   Colonoscopy 11/24/2016: -1 cm polyp s/p polypectomy. Bx-tubular adenoma -Repeat in 3 years  Colonoscopy 03/06/21 - prep was unsatisfactory.   Colonoscopy 04/16/21 - three 4-6 mm polyps in mid sigmoid colon, mid transverse, and distal transverse, removed. Mild colonic diverticulosis. Non-bleeding internal hemorrhoids. Path: Surgical [P], colon, sigmoid and transverse, polyp (3) - TUBULAR ADENOMA (1) WITHOUT HIGH GRADE DYSPLASIA. - HYPERPLASTIC POLYP (1) - COLONIC MUCOSA WITH BENIGN LYMPHOID AGGREGATE (1).   CT chest 05/2020 1. No evidence for acute pulmonary embolus. 2. 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 3. Stable to improved appearance of scattered, less than 5 mm lung nodules. As mentioned on the previous exam, in this high risk patient the remaining tiny nodules can be followed up with non-contrast chest CT in 12 months. This recommendation follows the consensus statement: Guidelines for Management of Incidental Pulmonary  Nodules Detected on CT images: From the Fleischner Society 2017; Radiology 017; [phone]. 4. Nonobstructing right renal calculus.  Aortic Atherosclerosis (ICD10-I70.0).   Interval History    Patient presents for follow-up on chronic constipation and GERD.  Patients GERD has improved with increasing the Omeprazole  to 40 mg and adding Pepcid  in the evening. She was taking after her dinner and having breakthrough symptoms shortly after dinner. Patient denies dysphagia.      Patient also has history of chronic constipation. She reports the Linzess  72 mcg worked some but thinks she Angel Avila benefit from higher dose. She is currently using OTC Miralax which hasn't helped much. She recently had abdominal xray for her kidneys that showed she was full of stool.   Wt Readings from Last 3 Encounters:  10/12/23 188 lb 2 oz (85.3 kg)  05/18/23 185 lb 12.8 oz (84.3 kg)  04/16/21 202 lb (91.6 kg)    Past Medical History:  Diagnosis Date   CAD (coronary artery disease), native coronary artery 03/02/2019   COPD (chronic obstructive pulmonary disease) (HCC) 02/22/2019   History of nephrolithiasis 03/02/2019   Inflammatory arthritis 07/14/2014   Laryngopharyngeal reflux 02/22/2019   Obstructive sleep apnea on CPAP    Palpitations 03/03/2017   Pulmonary nodules 12/26/2020   careeverywhere   Restless leg syndrome    Sjogren's syndrome 01/19/2017   Spondylolisthesis of lumbar region 02/17/2017   SVT (supraventricular tachycardia)    TACS (trigeminal autonomic cephalgias)    Trigeminal neuralgia    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     aspirin 81 MG chewable tablet Chew 81 mg by mouth daily.     azaTHIOprine (IMURAN) 50 MG tablet Take 1 tablet by mouth in the morning and at bedtime.     budesonide (PULMICORT) 1 MG/2ML nebulizer solution Inhale 1 mg into the lungs in the morning and at bedtime.     carvedilol (COREG) 12.5 MG tablet Take 12.5 mg by mouth daily.     Cholecalciferol 50 MCG (2000 UT) TABS Take 50 mcg by mouth daily.     diclofenac Sodium (VOLTAREN) 1 % GEL Apply 1 Application topically as needed.     Evolocumab (REPATHA SURECLICK) 140 MG/ML SOAJ Inject 140 mg into the skin every 14 (fourteen) days.     ezetimibe (ZETIA) 10 MG tablet Take 1 tablet by mouth daily.     FLUoxetine (PROZAC) 40 MG capsule Take 1 capsule by mouth 2 (two) times daily.     fluticasone (FLONASE) 50 MCG/ACT nasal spray Place 1 spray into both nostrils daily.     Fluticasone-Umeclidin-Vilant (TRELEGY ELLIPTA) 200-62.5-25 MCG/ACT AEPB Inhale 1 puff into the lungs daily.     folic acid (FOLVITE) 1 MG tablet Take 1 mg by mouth 2 (two) times daily.     gabapentin (NEURONTIN) 300 MG capsule Take 300 mg by mouth 3 (three) times daily.     hydroxychloroquine (PLAQUENIL) 200 MG tablet Take 2 tablets by mouth daily.     ipratropium-albuterol  (DUONEB) 0.5-2.5 (3) MG/3ML SOLN Inhale 3 mLs into the lungs every 6 (six) hours.     linaclotide  (LINZESS ) 145 MCG CAPS capsule Take 1 capsule (145 mcg total) by mouth daily before breakfast. 30 capsule 3   lisinopril (ZESTRIL) 5 MG tablet Take 5 mg by mouth daily.     mirabegron ER (MYRBETRIQ) 50 MG TB24 tablet Take 50 mg by mouth daily.     montelukast (SINGULAIR) 10 MG tablet Take 1 tablet by mouth at bedtime.     nabumetone (RELAFEN) 500 MG tablet Take by mouth.     oxcarbazepine (TRILEPTAL) 600 MG tablet Take by mouth.     Oxycodone HCl 10 MG TABS Take 1 tablet by mouth 2 (two) times daily as needed.     pilocarpine (SALAGEN) 7.5 MG tablet Take 7.5 mg  by mouth 3 (three) times daily.     POLYETHYLENE GLYCOL 400 OP Apply 1 drop to eye 4 (four) times daily.     predniSONE (DELTASONE) 5 MG tablet Take 1 tablet by mouth daily.     Probiotic Product (PROBIOTIC PO) Take 1 capsule by mouth daily.     rosuvastatin (CRESTOR) 10 MG tablet Take 10 mg by mouth daily.     solifenacin (VESICARE) 10 MG tablet Take by mouth.     tamsulosin (FLOMAX) 0.4 MG CAPS capsule Take 0.4 mg by mouth at bedtime.     famotidine  (PEPCID ) 20 MG tablet Take 1 tablet (20 mg total) by mouth 2 (two) times daily. 90 tablet 3   omeprazole  (PRILOSEC) 40 MG capsule Take 1 capsule (40 mg total) by mouth daily. 90 capsule 3   No current facility-administered medications for this visit.    Allergies as of 10/12/2023 - Review Complete 10/12/2023  Allergen Reaction Noted  Sulfa antibiotics  03/29/2018    Family History  Problem Relation Age of Onset   Heart disease Mother    Breast cancer Mother    Coronary artery disease Mother    Hypertension Mother    Lung disease Mother    Lupus Mother    Thyroid  disease Mother    Liver cancer Mother    Arthritis/Rheumatoid Father    Lupus Sister    Diabetes Sister    Breast cancer Sister    Diabetes Brother    Crohn's disease Brother    Autoimmune disease Brother        hyperhydrosis   Glaucoma Maternal Grandmother    Macular degeneration Maternal Grandmother    Uterine cancer Paternal Grandmother    Colon cancer Paternal Grandmother    Autoimmune disease Daughter        hyperhydrosis   Stomach cancer Neg Hx    Rectal cancer Neg Hx     Review of Systems:    Constitutional: No weight loss, fever, chills, weakness or fatigue HEENT: Eyes: No change in vision               Ears, Nose, Throat:  No change in hearing or congestion Skin: No rash or itching Cardiovascular: No chest pain, chest pressure or palpitations   Respiratory: No SOB or cough Gastrointestinal: See HPI and otherwise negative Genitourinary: No dysuria  or change in urinary frequency Neurological: No headache, dizziness or syncope Musculoskeletal: No new muscle or joint pain Hematologic: No bleeding or bruising Psychiatric: No history of depression or anxiety    Physical Exam:  Vital signs: BP 104/60 (BP Location: Left Arm, Patient Position: Sitting, Cuff Size: Normal)   Pulse 64   Ht 5' 9.5 (1.765 m) Comment: height measured without shoes  Wt 188 lb 2 oz (85.3 kg)   BMI 27.38 kg/m   Constitutional:   Pleasant  female appears to be in NAD, Well developed, Well nourished, alert and cooperative Throat: Oral cavity and pharynx without inflammation, swelling or lesion.  Respiratory: Respirations even and unlabored. Lungs clear to auscultation bilaterally.   No wheezes, crackles, or rhonchi.  Cardiovascular: Normal S1, S2. Regular rate and rhythm. No peripheral edema, cyanosis or pallor.  Gastrointestinal:  Soft, nondistended, nontender. No rebound or guarding. Normal bowel sounds. No appreciable masses or hepatomegaly. Rectal:  Not performed.  Msk:  Symmetrical without gross deformities. Without edema, no deformity or joint abnormality.  Neurologic:  Alert and  oriented x4;  grossly normal neurologically.  Skin:   Dry and intact without significant lesions or rashes. Psychiatric: Oriented to person, place and time. Demonstrates good judgement and reason without abnormal affect or behaviors.  RELEVANT LABS AND IMAGING: 02/25/2023 XR Abd- Visualization is limited by constipation.  09/18/23 Abd xray Findings:  There is a nonspecific bowel gas pattern.   There are normal soft tissue shadows.  There are normal findings of the lumbosacral spine.  Location of calcifications:   Kidney:  none   Ureter:   none  Visualization is limited by constipation.    Assessment: Encounter Diagnoses  Name Primary?   Gastroesophageal reflux disease, unspecified whether esophagitis present Yes   Chronic idiopathic constipation       58 year old female  patient who presents for follow-up on GERD and chronic constipation. She has done with increased dose of Omeprazole  and adding Pepcid  at bedtime. Recommended she change the Pepcid  dose to before dinner. Avoid late night meals.     For the constipation not relieved  with over-the-counter laxatives or high-fiber diet will give her samples of pro secretory agent Linzess  145 mcg p.o. daily.   History of colonic polyps, recall in March 2026.  Plan: - Continue Omeprazole  40 mg po daily -Take Pepcid  20 mg before dinner, and bedtime prn -Recommend GERD diet, no late meals 3-4 hours before lying down. -Samples of Linzess  145 mcg po daily -recall colonoscopy 03/2024  Thank you for the courtesy of this consult. Please call me with any questions or concerns.   Chistopher Mangino, FNP-C [facility] 10/12/2023, 2:33 PM  Cc: No ref. provider found

## 2023-10-12 NOTE — Patient Instructions (Addendum)
 GERD Continue Omeprazole  40 mg po daily Take Pepcid  20 mg before dinner, and bedtime prn GERD diet, no late meals 3-4 hours before lying down.   Chronic constipation Samples of Linzess  145 mcg po daily, take 1 tablet 30-45 minutes before first meal of day with full glass of water  _______________________________________________________  If your blood pressure at your visit was 140/90 or greater, please contact your primary care physician to follow up on this.  _______________________________________________________  If you are age 26 or older, your body mass index should be between 23-30. Your Body mass index is 27.38 kg/m. If this is out of the aforementioned range listed, please consider follow up with your Primary Care Provider.  If you are age 89 or younger, your body mass index should be between 19-25. Your Body mass index is 27.38 kg/m. If this is out of the aformentioned range listed, please consider follow up with your Primary Care Provider.   ________________________________________________________  The [facility] providers would like to encourage you to use MYCHART to communicate with providers for non-urgent requests or questions.  Due to long hold times on the telephone, sending your provider a message by [facility] may be a faster and more efficient way to get a response.  Please allow 48 business hours for a response.  Please remember that this is for non-urgent requests.  _______________________________________________________  [facility] is using a team-based approach to care.  Your team is made up of your doctor and two to three APPS. Our APPS (Nurse Practitioners and Physician Assistants) work with your physician to ensure care continuity for you. They are fully qualified to address your health concerns and develop a treatment plan. They communicate directly with your gastroenterologist to care for you. Seeing the Advanced Practice Practitioners on your  physician's team can help you by facilitating care more promptly, often allowing for earlier appointments, access to diagnostic testing, procedures, and other specialty referrals.   Thank you for trusting me with your gastrointestinal care. Deanna May, FNP-C

## 2023-11-13 ENCOUNTER — Telehealth: Payer: Self-pay | Admitting: Gastroenterology

## 2023-11-13 NOTE — Telephone Encounter (Signed)
 Inbound call from patient stating her anemia is worsening and her provider is recommending a endoscopy and colonoscopy. Patient is requesting a call to discuss further. Please advise, thank you.

## 2023-11-16 NOTE — Telephone Encounter (Signed)
 Pt stated that her PCP recommended her to have an EGD and colonoscopy due to her recent labs that were ordered.  Orders for labs in Epic.10/26/2023  Hemoccult positive on 11/05/2023  Recent office visit on 10/12/2023 with [facility] May NP. Pt states that she feels bloated and abdominal pain. Left lower abdomen.  Last BM two days ago.  Please review and advise

## 2023-11-17 ENCOUNTER — Other Ambulatory Visit: Payer: Self-pay | Admitting: Medical Genetics

## 2023-11-17 ENCOUNTER — Other Ambulatory Visit: Payer: Self-pay

## 2023-11-17 DIAGNOSIS — R195 Other fecal abnormalities: Secondary | ICD-10-CM

## 2023-11-17 DIAGNOSIS — K219 Gastro-esophageal reflux disease without esophagitis: Secondary | ICD-10-CM

## 2023-11-17 DIAGNOSIS — D649 Anemia, unspecified: Secondary | ICD-10-CM

## 2023-11-17 DIAGNOSIS — Z860100 Personal history of colon polyps, unspecified: Secondary | ICD-10-CM

## 2023-11-17 NOTE — Telephone Encounter (Signed)
 Pt made aware of Deanna May NP recommendations. Pt was scheduled for the EGD/Colon on 12/21/2023 at 2:00 PM with DR. Charlanne in the [facility]. Pt made aware.  Ambulatory referral placed in Epic.  Prep instructions were sent to pt via my chart. Pt made aware.  Pt verbalized understanding with all questions answered.

## 2023-12-10 ENCOUNTER — Encounter: Payer: Self-pay | Admitting: Gastroenterology

## 2023-12-14 ENCOUNTER — Other Ambulatory Visit

## 2023-12-16 ENCOUNTER — Telehealth: Payer: Self-pay | Admitting: *Deleted

## 2023-12-16 NOTE — Telephone Encounter (Signed)
 This patient needs to be scheduled in [facility], please RG

## 2023-12-16 NOTE — Telephone Encounter (Signed)
 Dr. Charlanne,  This pt is scheduled with you on December 1.  She has severe COPD which disqualifies her for care at [facility].   Best regards,   Norleen EMERSON Schillings

## 2023-12-16 NOTE — Telephone Encounter (Signed)
 Dr. Suzann,  Please disregard the last note, it needed to be directed to Dr. Charlanne.  Thanks,  Norleen EMERSON Schillings

## 2023-12-16 NOTE — Telephone Encounter (Signed)
 Dr. Johnita Cruz,  This pt is scheduled with you on December 1.  She has severe COPD which disqualifies her for care at [facility].  Best regards,  Norleen EMERSON Schillings

## 2023-12-21 ENCOUNTER — Encounter: Admitting: Gastroenterology

## 2023-12-21 NOTE — Telephone Encounter (Signed)
 Pt made aware of Dr. Charlanne recommendations.  Procedure for today was canceled. Pt made aware.  Pt was notified that I would call her later today with Dr. Charlanne availability at the hospital.  Pt verbalized understanding with all questions answered.

## 2023-12-22 ENCOUNTER — Other Ambulatory Visit: Payer: Self-pay

## 2023-12-22 ENCOUNTER — Other Ambulatory Visit: Payer: Self-pay | Admitting: Medical Genetics

## 2023-12-22 DIAGNOSIS — Z006 Encounter for examination for normal comparison and control in clinical research program: Secondary | ICD-10-CM

## 2023-12-22 DIAGNOSIS — Z860100 Personal history of colon polyps, unspecified: Secondary | ICD-10-CM

## 2023-12-22 DIAGNOSIS — R1032 Left lower quadrant pain: Secondary | ICD-10-CM

## 2023-12-22 DIAGNOSIS — K5904 Chronic idiopathic constipation: Secondary | ICD-10-CM

## 2023-12-22 DIAGNOSIS — R140 Abdominal distension (gaseous): Secondary | ICD-10-CM

## 2023-12-22 DIAGNOSIS — D649 Anemia, unspecified: Secondary | ICD-10-CM

## 2023-12-22 DIAGNOSIS — K219 Gastro-esophageal reflux disease without esophagitis: Secondary | ICD-10-CM

## 2023-12-22 DIAGNOSIS — R195 Other fecal abnormalities: Secondary | ICD-10-CM

## 2023-12-22 NOTE — Telephone Encounter (Signed)
 Left message for pt to call back  02/16/2024

## 2023-12-22 NOTE — Telephone Encounter (Signed)
 Pt was scheduled for the EGD/Colonoscopy on 02/16/2024 at [facility] at 10:30 AM with Dr. Charlanne. Pt made aware. Case ID [id number] Pt to arrive at 9:00 AM. Pt made aware.  Ambulatory referral to GI placed in Epic.  Prep instructions was sent to pt via my chart. Pt made aware.   Pt verbalized understanding with all questions answered.

## 2023-12-22 NOTE — Telephone Encounter (Signed)
 Patient returning call Requesting a call back  Please advise  Thank you

## 2023-12-30 LAB — GENECONNECT MOLECULAR SCREEN: Genetic Analysis Overall Interpretation: NEGATIVE

## 2024-01-12 NOTE — Progress Notes (Addendum)
 "  Subjective:    Angel Avila is a 58 y.o. (DOB [birth year]) female.  No chief complaint on file.    HPI Patient seen today via Telehealth by agreement and consent of patient. Visit was done using Doximity audio and video. Patient was at home for today's visit. This patient encounter is reasonable and appropriate under the circumstances given the patients presentation at this time. The patient has been advised of the potential risks and limitations of this mode of treatment, (including but not limited to the absence of an in-person examination) and has agreed to be treated in a remote fashion in spite of them. Any and all of the patients questions on this issue have been answered and I have made no promises or guarantees to the patient. The patient has also been advised to contact this office for worsening conditions or problems, and seek emergency medical treatment and/or call 911 if the patient deems either necessary.   History of anterior/posterior L4-S1 PSF on 01/30/2017 by Dr. Gust. Chronic LBP. Cervical spondylosis and chronic neck pain.  She presents today for recheck of her chronic low back pain, chronic neck pain and medication check.  She reports her pain is stable and at its baseline today.  She does however note increasing paresthesia and numbness into her upper extremities.  She states this happens both during the day and occasionally at night.  She says all of her fingers are affected.  She has not been able to associated particular activity with the numbness.  She denies radiation into her lower extremities.  She continues to do very well managing her pain with activity modification, home treatments and medications.  She has previously responded well to both prednisone Dosepaks and Toradol for flareups of pain.  She also responds well to guided injections (see details below).  No denies red flag S/S.    Reviewed and updated this visit by provider: None       Review  of Systems  Constitutional:  Negative for activity change, appetite change, chills, diaphoresis, fatigue, fever and unexpected weight change.  Respiratory:  Negative for shortness of breath.   Cardiovascular:  Negative for chest pain.  Genitourinary:  Negative for difficulty urinating.      Objective:   There were no vitals filed for this visit.   Physical Exam Constitutional:      General: She is not in acute distress.    Appearance: Normal appearance. She is not ill-appearing or toxic-appearing.  Musculoskeletal:     Cervical back: No deformity.     Thoracic back: No deformity.  Neurological:     Mental Status: She is alert.      Imaging: -X-rays right hip, 11/22/21. Mild OA, no acute findings. -X-rays C-spine 01/04/20 showing diffuse spondylosis, decreased lordosis, disc height decreased worst at C5-7, no acute findings.  -X-rays L-spine done 11/23/17, lucency around posterior S1 screw.   Injections: -Right hip IA injection 01/10/22 with Dr. Tobie, 30-50% sustained improvement.   -C7-T1 ILESI, 09/16/21 with 40-45% improved pain,  -C7-T1 ILESI, 09/04/20 with 75% improvement.  -C7-T1 ILESI 03/06/20, initially with 90% relief, 75% pain relief for 3.5-4 weeks, then sustained 50% relief that is slowly decreased.   Assessment / Plan:   Assessment 1. Chronic pain syndrome (Primary) 2. History of lumbar fusion 3. Chronic bilateral low back pain, unspecified whether sciatica present 4. Spondylosis of cervical region without myelopathy or radiculopathy 5. Cervicalgia      Plan Patient will participate in activities as  tolerated using pain as their guide. Patient was educated on current diagnosis and typical treatment algorithm.  She is doing well with current treatments.  She will continue activity modification and home treatments as needed.  She will continue to maintain her home exercise program as her symptoms allow.  She has a history of good relief with hip IA injection and  cervical ILESI.  Persistent LBP. We will continue to monitor. Continue to remain as active as pain allows. Maintain spine precautions.  She reports she is doing well at this time and declines any further treatments.  Increasing upper extremity paresthesias and numbness.  Offered patient an appointment in office she declined.  She will be seen next month in person.  Will update x-rays C-spine at that time.  We will consider an MRI of the cervical spine if it seems to be coming from her neck.  PMH: Currently with anemia, going through workup for possible GI bleed.  She was diagnosed with stage 4 sarcoidosis in 2019. 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 She will continue with PCP and rheumatology. She has also been dx with Lupus and Sjogren's.   Reviewed patient medications today. Continued prescribing of current medications is medically necessary to allow patient to maintain activity and quality of life. They take medications as prescribed. Medications improve their quality of life and level of function. Patient does not display aberrant behavior. Prescription monitoring report reviewed and appropriate. Upon discussion today, patient understands opioid therapy is optional and feels pain has been refractory to reasonable conservative measures and is significant and affecting quality of life enough to warrant ongoing therapy and wishes to continue opioids. PDMP reviewed and appropriate. Discussed medications and prescribing with Dr. Gust who agrees with plan. ORT reviewed, score 1, low risk. UDT results from 11/12/2023 were reviewed with patient and were appropriate and consistent with current prescribing. Meds: -Refilled Oxycodone 10 mg, #120, due 02/13/2024   Questions were encouraged and answered today. Instructed to call with any new questions or concerns. Greater than 30 minutes was spent today reviewing patient chart, reviewing history, physical exam, education and  counseling, medication management, and documentation.   Follow up with Angel Avila in 4 weeks, [facility], for med check and prn.  Patient prefers telehealth when possible due to significant difficulty with travel due to her numerous medical issues.  She understands she needs to be seen in person every 3 to 4 months, April/May.  Risks, benefits, and alternatives of the medications and treatment plan prescribed today were discussed, and patient expressed understanding. Plan follow-up as discussed or as needed if any worsening symptoms or change in condition.          "

## 2024-02-03 ENCOUNTER — Encounter (HOSPITAL_COMMUNITY): Payer: Self-pay | Admitting: Gastroenterology

## 2024-02-03 NOTE — Progress Notes (Signed)
 Angel Avila    PCP-Welsh MD  Cardiologist-Cheek MD [facility]  Pulmonologist-Maier PA at [facility]  EKG-07/28/22 Echo-08/27/22 Cath- 09/29/22   Stress-n/a ICD/PM-n/a GLP1-n/a Blood Thinner-n/a  History:CAD, COPD (severe), Neuralgia, Sjrogens, SVT, TACs, Emphysema, OSA. Patient last saw her pulm office 09/23/23 with f/u 1 year. Last saw cardiology 08/10/23 with looks like f/u in feb this year. At that cards appt they did hear a murmur but at time patient not symptomatic. Per pt she does have COPD that's why they couldn't do in office but right now no issues with breathing/heart/ no 02/ does use cane for long distance due to back pain.       Anesthesia Review- Yes- as long as asymptomatic ok

## 2024-02-09 ENCOUNTER — Telehealth: Payer: Self-pay

## 2024-02-09 NOTE — Telephone Encounter (Signed)
 Procedure:EGD Procedure date: 02/16/24 Procedure location: [facility] Arrival Time: 8:56 Spoke with the patient Y/N: N Any prep concerns? N Has the patient obtained the prep from the pharmacy ? N Do you have a care partner and transportation: N Any additional concerns? N   I tried to call patient 3 times and the phone would ring and then a busy signal. I was unable to leave a detailed message about the procedure.

## 2024-02-10 NOTE — Telephone Encounter (Signed)
 Spoke with pt.  Pt stated that she is all set for her upcoming procedure and has no concerns. Pt verbalized understanding with all questions answered.

## 2024-02-16 ENCOUNTER — Ambulatory Visit (HOSPITAL_COMMUNITY): Admitting: Certified Registered"

## 2024-02-16 ENCOUNTER — Ambulatory Visit (HOSPITAL_COMMUNITY)
Admission: RE | Admit: 2024-02-16 | Discharge: 2024-02-16 | Disposition: A | Discharge status: Home / Self Care | Attending: Gastroenterology | Admitting: Gastroenterology

## 2024-02-16 ENCOUNTER — Encounter (HOSPITAL_COMMUNITY)
Admission: RE | Disposition: A | Discharge status: Home / Self Care | Payer: Self-pay | Source: Home / Self Care | Attending: Gastroenterology

## 2024-02-16 ENCOUNTER — Encounter (HOSPITAL_COMMUNITY): Payer: Self-pay | Admitting: Gastroenterology

## 2024-02-16 ENCOUNTER — Other Ambulatory Visit: Payer: Self-pay

## 2024-02-16 DIAGNOSIS — K5730 Diverticulosis of large intestine without perforation or abscess without bleeding: Secondary | ICD-10-CM | POA: Insufficient documentation

## 2024-02-16 DIAGNOSIS — J449 Chronic obstructive pulmonary disease, unspecified: Secondary | ICD-10-CM | POA: Diagnosis not present

## 2024-02-16 DIAGNOSIS — Z79899 Other long term (current) drug therapy: Secondary | ICD-10-CM | POA: Diagnosis not present

## 2024-02-16 DIAGNOSIS — K5904 Chronic idiopathic constipation: Secondary | ICD-10-CM

## 2024-02-16 DIAGNOSIS — I10 Essential (primary) hypertension: Secondary | ICD-10-CM | POA: Diagnosis not present

## 2024-02-16 DIAGNOSIS — G4733 Obstructive sleep apnea (adult) (pediatric): Secondary | ICD-10-CM | POA: Insufficient documentation

## 2024-02-16 DIAGNOSIS — M3500 Sicca syndrome, unspecified: Secondary | ICD-10-CM | POA: Insufficient documentation

## 2024-02-16 DIAGNOSIS — Q439 Congenital malformation of intestine, unspecified: Secondary | ICD-10-CM | POA: Diagnosis not present

## 2024-02-16 DIAGNOSIS — Z860100 Personal history of colon polyps, unspecified: Secondary | ICD-10-CM

## 2024-02-16 DIAGNOSIS — Z87891 Personal history of nicotine dependence: Secondary | ICD-10-CM | POA: Diagnosis not present

## 2024-02-16 DIAGNOSIS — Z1211 Encounter for screening for malignant neoplasm of colon: Secondary | ICD-10-CM | POA: Diagnosis present

## 2024-02-16 DIAGNOSIS — R140 Abdominal distension (gaseous): Secondary | ICD-10-CM

## 2024-02-16 DIAGNOSIS — D649 Anemia, unspecified: Secondary | ICD-10-CM

## 2024-02-16 DIAGNOSIS — K2970 Gastritis, unspecified, without bleeding: Secondary | ICD-10-CM | POA: Diagnosis not present

## 2024-02-16 DIAGNOSIS — K449 Diaphragmatic hernia without obstruction or gangrene: Secondary | ICD-10-CM | POA: Insufficient documentation

## 2024-02-16 DIAGNOSIS — K219 Gastro-esophageal reflux disease without esophagitis: Secondary | ICD-10-CM | POA: Diagnosis not present

## 2024-02-16 DIAGNOSIS — K640 First degree hemorrhoids: Secondary | ICD-10-CM | POA: Diagnosis not present

## 2024-02-16 DIAGNOSIS — I2510 Atherosclerotic heart disease of native coronary artery without angina pectoris: Secondary | ICD-10-CM | POA: Insufficient documentation

## 2024-02-16 DIAGNOSIS — R1032 Left lower quadrant pain: Secondary | ICD-10-CM

## 2024-02-16 DIAGNOSIS — R195 Other fecal abnormalities: Secondary | ICD-10-CM

## 2024-02-16 MED ORDER — SODIUM CHLORIDE 0.9 % IV SOLN: INTRAVENOUS | Status: DC

## 2024-02-16 MED ORDER — PROPOFOL 10 MG/ML IV BOLUS
INTRAVENOUS | Status: DC | PRN
  Administered 2024-02-16: 30 mg via INTRAVENOUS

## 2024-02-16 MED ORDER — PROPOFOL 500 MG/50ML IV EMUL
INTRAVENOUS | Status: DC | PRN
  Administered 2024-02-16: 125 ug/kg/min via INTRAVENOUS

## 2024-02-16 MED ORDER — LABETALOL HCL 5 MG/ML IV SOLN
INTRAVENOUS | Status: DC | PRN
  Administered 2024-02-16: 5 mg via INTRAVENOUS

## 2024-02-16 MED ORDER — LIDOCAINE 2% (20 MG/ML) 5 ML SYRINGE
INTRAMUSCULAR | Status: DC | PRN
  Administered 2024-02-16: 80 mg via INTRAVENOUS

## 2024-02-16 MED ORDER — PROPOFOL 1000 MG/100ML IV EMUL
INTRAVENOUS | Status: AC
  Filled 2024-02-16: qty 100

## 2024-02-16 NOTE — Transfer of Care (Signed)
 Immediate Anesthesia Transfer of Care Note  Patient: Angel Avila  Procedure(s) Performed: EGD (ESOPHAGOGASTRODUODENOSCOPY) COLONOSCOPY BIOPSY, SKIN, SUBCUTANEOUS TISSUE, OR MUCOUS MEMBRANE  Patient Location: PACU  Anesthesia Type:MAC  Level of Consciousness: awake, alert , and oriented  Airway & Oxygen Therapy: Patient Spontanous Breathing and Patient connected to face mask oxygen  Post-op Assessment: Report given to RN and Post -op Vital signs reviewed and stable  Post vital signs: Reviewed and stable  Last Vitals:  Vitals Value Taken Time  BP 164/79 02/16/24 10:27  Temp    Pulse 68 02/16/24 10:29  Resp 27 02/16/24 10:29  SpO2 99 % 02/16/24 10:29  Vitals shown include unfiled device data.  Last Pain:  Vitals:   02/16/24 1026  TempSrc:   PainSc: 0-No pain      Patients Stated Pain Goal: 0 (02/16/24 0919)  Complications: No notable events documented.

## 2024-02-16 NOTE — H&P (Signed)
 " Expand All Collapse All     Chief Complaint:follow-up GERD, constipation Primary GI Doctor:Dr. Charlanne   HPI: Angel Avila is a 59 y.o. female  Sjogren's syndrome with inflammatory arthritis/pulm nodules/neuropathy on imuran/Plaquenil/oxycodone Last seen by Dr. Charlanne on 04/16/21. No significant GI complaints.   She does have morning nausea which she had for many years.  Likely related to polypharmacy including narcotics.   Occasional constipation with LLQ discomfort which gets better with BMs.  She has been taking Metamucil with plenty of water every day with resultant improvement.   No recent weight loss or loss of appetite.  No fever chills or night sweats.  No jaundice dark urine or pale stools.   Past GI work-up:   EGD 09/10/2016 -Mild gastritis -Small hiatal hernia. -Neg SB bx for celiac   Colonoscopy 11/24/2016: -1 cm polyp s/p polypectomy. Bx-tubular adenoma -Repeat in 3 years   Colonoscopy 03/06/21 - prep was unsatisfactory.    Colonoscopy 04/16/21 - three 4-6 mm polyps in mid sigmoid colon, mid transverse, and distal transverse, removed. Mild colonic diverticulosis. Non-bleeding internal hemorrhoids. Path: Surgical [P], colon, sigmoid and transverse, polyp (3) - TUBULAR ADENOMA (1) WITHOUT HIGH GRADE DYSPLASIA. - HYPERPLASTIC POLYP (1) - COLONIC MUCOSA WITH BENIGN LYMPHOID AGGREGATE (1).   CT chest 05/2020 1. No evidence for acute pulmonary embolus. 2. 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 3. Stable to improved appearance of scattered, less than 5 mm lung nodules. As mentioned on the previous exam, in this high risk patient the remaining tiny nodules can be followed up with non-contrast chest CT in 12 months. This recommendation follows the consensus statement: Guidelines for Management  of Incidental Pulmonary Nodules Detected on CT images: From the Fleischner Society 2017; Radiology 017; [phone]. 4. Nonobstructing right renal calculus.  Aortic Atherosclerosis (ICD10-I70.0).    Interval History    Patient presents for follow-up on chronic constipation and GERD.  Patients GERD has improved with increasing the Omeprazole  to 40 mg and adding Pepcid  in the evening. She was taking after her dinner and having breakthrough symptoms shortly after dinner. Patient denies dysphagia.       Patient also has history of chronic constipation. She reports the Linzess  72 mcg worked some but thinks she may benefit from higher dose. She is currently using OTC Miralax which hasn't helped much. She recently had abdominal xray for her kidneys that showed she was full of stool.       Wt Readings from Last 3 Encounters:  10/12/23 188 lb 2 oz (85.3 kg)  05/18/23 185 lb 12.8 oz (84.3 kg)  04/16/21 202 lb (91.6 kg)        Past Medical History:  Diagnosis Date   CAD (coronary artery disease), native coronary artery 03/02/2019   COPD (chronic obstructive pulmonary disease) (HCC) 02/22/2019   History of nephrolithiasis 03/02/2019   Inflammatory arthritis 07/14/2014   Laryngopharyngeal reflux 02/22/2019   Obstructive sleep apnea on CPAP     Palpitations 03/03/2017   Pulmonary nodules 12/26/2020    careeverywhere   Restless leg syndrome     Sjogren's syndrome 01/19/2017   Spondylolisthesis of lumbar region 02/17/2017   SVT (supraventricular tachycardia)     TACS (trigeminal autonomic cephalgias)     Trigeminal neuralgia     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  Past Surgical History:  Procedure Laterality Date   BACK SURGERY       BRONCHOSCOPY       COLONOSCOPY       orif left elbow       SALIVARY STONE REMOVAL       TONSILLECTOMY       TOTAL ABDOMINAL HYSTERECTOMY                    Current Outpatient Medications   Medication Sig Dispense Refill   albuterol  (VENTOLIN  HFA) 108 (90 Base) MCG/ACT inhaler Inhale 2 puffs into the lungs every 6 (six) hours.       aspirin 81 MG chewable tablet Chew 81 mg by mouth daily.       azaTHIOprine (IMURAN) 50 MG tablet Take 1 tablet by mouth in the morning and at bedtime.       budesonide (PULMICORT) 1 MG/2ML nebulizer solution Inhale 1 mg into the lungs in the morning and at bedtime.       carvedilol (COREG) 12.5 MG tablet Take 12.5 mg by mouth daily.       Cholecalciferol 50 MCG (2000 UT) TABS Take 50 mcg by mouth daily.       diclofenac Sodium (VOLTAREN) 1 % GEL Apply 1 Application topically as needed.       Evolocumab (REPATHA SURECLICK) 140 MG/ML SOAJ Inject 140 mg into the skin every 14 (fourteen) days.       ezetimibe (ZETIA) 10 MG tablet Take 1 tablet by mouth daily.       FLUoxetine (PROZAC) 40 MG capsule Take 1 capsule by mouth 2 (two) times daily.       fluticasone (FLONASE) 50 MCG/ACT nasal spray Place 1 spray into both nostrils daily.       Fluticasone-Umeclidin-Vilant (TRELEGY ELLIPTA) 200-62.5-25 MCG/ACT AEPB Inhale 1 puff into the lungs daily.       folic acid (FOLVITE) 1 MG tablet Take 1 mg by mouth 2 (two) times daily.       gabapentin (NEURONTIN) 300 MG capsule Take 300 mg by mouth 3 (three) times daily.       hydroxychloroquine (PLAQUENIL) 200 MG tablet Take 2 tablets by mouth daily.       ipratropium-albuterol  (DUONEB) 0.5-2.5 (3) MG/3ML SOLN Inhale 3 mLs into the lungs every 6 (six) hours.       linaclotide  (LINZESS ) 145 MCG CAPS capsule Take 1 capsule (145 mcg total) by mouth daily before breakfast. 30 capsule 3   lisinopril (ZESTRIL) 5 MG tablet Take 5 mg by mouth daily.       mirabegron ER (MYRBETRIQ) 50 MG TB24 tablet Take 50 mg by mouth daily.       montelukast (SINGULAIR) 10 MG tablet Take 1 tablet by mouth at bedtime.       nabumetone (RELAFEN) 500 MG tablet Take by mouth.       oxcarbazepine (TRILEPTAL) 600 MG tablet Take by mouth.        Oxycodone HCl 10 MG TABS Take 1 tablet by mouth 2 (two) times daily as needed.       pilocarpine (SALAGEN) 7.5 MG tablet Take 7.5 mg by mouth 3 (three) times daily.       POLYETHYLENE GLYCOL 400 OP Apply 1 drop to eye 4 (four) times daily.       predniSONE (DELTASONE) 5 MG tablet Take 1 tablet by mouth daily.       Probiotic Product (PROBIOTIC PO) Take 1 capsule by mouth daily.  rosuvastatin (CRESTOR) 10 MG tablet Take 10 mg by mouth daily.       solifenacin (VESICARE) 10 MG tablet Take by mouth.       tamsulosin (FLOMAX) 0.4 MG CAPS capsule Take 0.4 mg by mouth at bedtime.       famotidine  (PEPCID ) 20 MG tablet Take 1 tablet (20 mg total) by mouth 2 (two) times daily. 90 tablet 3   omeprazole  (PRILOSEC) 40 MG capsule Take 1 capsule (40 mg total) by mouth daily. 90 capsule 3      No current facility-administered medications for this visit.             Allergies as of 10/12/2023 - Review Complete 10/12/2023  Allergen Reaction Noted   Sulfa antibiotics   03/29/2018           Family History  Problem Relation Age of Onset   Heart disease Mother     Breast cancer Mother     Coronary artery disease Mother     Hypertension Mother     Lung disease Mother     Lupus Mother     Thyroid  disease Mother     Liver cancer Mother     Arthritis/Rheumatoid Father     Lupus Sister     Diabetes Sister     Breast cancer Sister     Diabetes Brother     Crohn's disease Brother     Autoimmune disease Brother          hyperhydrosis   Glaucoma Maternal Grandmother     Macular degeneration Maternal Grandmother     Uterine cancer Paternal Grandmother     Colon cancer Paternal Grandmother     Autoimmune disease Daughter          hyperhydrosis   Stomach cancer Neg Hx     Rectal cancer Neg Hx            Review of Systems:    Constitutional: No weight loss, fever, chills, weakness or fatigue HEENT: Eyes: No change in vision               Ears, Nose, Throat:  No change in hearing or  congestion Skin: No rash or itching Cardiovascular: No chest pain, chest pressure or palpitations   Respiratory: No SOB or cough Gastrointestinal: See HPI and otherwise negative Genitourinary: No dysuria or change in urinary frequency Neurological: No headache, dizziness or syncope Musculoskeletal: No new muscle or joint pain Hematologic: No bleeding or bruising Psychiatric: No history of depression or anxiety      Physical Exam:  Vital signs: BP 104/60 (BP Location: Left Arm, Patient Position: Sitting, Cuff Size: Normal)   Pulse 64   Ht 5' 9.5 (1.765 m) Comment: height measured without shoes  Wt 188 lb 2 oz (85.3 kg)   BMI 27.38 kg/m    Constitutional:   Pleasant  female appears to be in NAD, Well developed, Well nourished, alert and cooperative Throat: Oral cavity and pharynx without inflammation, swelling or lesion.  Respiratory: Respirations even and unlabored. Lungs clear to auscultation bilaterally.   No wheezes, crackles, or rhonchi.  Cardiovascular: Normal S1, S2. Regular rate and rhythm. No peripheral edema, cyanosis or pallor.  Gastrointestinal:  Soft, nondistended, nontender. No rebound or guarding. Normal bowel sounds. No appreciable masses or hepatomegaly. Rectal:  Not performed.  Msk:  Symmetrical without gross deformities. Without edema, no deformity or joint abnormality.  Neurologic:  Alert and  oriented x4;  grossly normal neurologically.  Skin:  Dry and intact without significant lesions or rashes. Psychiatric: Oriented to person, place and time. Demonstrates good judgement and reason without abnormal affect or behaviors.   RELEVANT LABS AND IMAGING: 02/25/2023 XR Abd- Visualization is limited by constipation.  09/18/23 Abd xray Findings:  There is a nonspecific bowel gas pattern.   There are normal soft tissue shadows.  There are normal findings of the lumbosacral spine.  Location of calcifications:   Kidney:  none   Ureter:   none  Visualization is limited  by constipation.      Assessment:     Encounter Diagnoses  Name Primary?   Gastroesophageal reflux disease, unspecified whether esophagitis present Yes   Chronic idiopathic constipation        59 year old female patient who presents for follow-up on GERD and chronic constipation. She has done with increased dose of Omeprazole  and adding Pepcid  at bedtime. Recommended she change the Pepcid  dose to before dinner. Avoid late night meals.     For the constipation not relieved with over-the-counter laxatives or high-fiber diet will give her samples of pro secretory agent Linzess  145 mcg p.o. daily.    History of colonic polyps, recall in March 2026.   Plan: - Continue Omeprazole  40 mg po daily -Take Pepcid  20 mg before dinner, and bedtime prn -Recommend GERD diet, no late meals 3-4 hours before lying down. -Samples of Linzess  145 mcg po daily -recall colonoscopy 03/2024   Thank you for the courtesy of this consult. Please call me with any questions or concerns.    Deanna May, FNP-C [facility]      Attending physician's note   I have taken history, reviewed the chart and examined the patient. I performed a substantive portion of this encounter, including complete performance of at least one of the key components, in conjunction with the APP. I agree with the Advanced Practitioner's note, impression and recommendations.   For EGD/colon today   Anselm Bring, MD [facility] [phone]  "

## 2024-02-16 NOTE — Op Note (Signed)
 [facility] Patient Name: Angel Avila Procedure Date: 02/16/2024 MRN: [id number] Attending MD: Lynnie Bring , MD, [id number] Date of Birth: Dec 18, 1965 CSN: [id number] Age: 59 Admit Type: Outpatient Procedure:                Colonoscopy Indications:              High risk colon cancer surveillance: Personal                            history of colonic polyps Providers:                Lynnie Bring, MD, Ozell Pouch, Haskel Chris, Technician Referring MD:             Dr Octaviano Hobby Medicines:                Monitored Anesthesia Care Complications:            No immediate complications. Estimated Blood Loss:     Estimated blood loss: none. Procedure:                Pre-Anesthesia Assessment:                           - Prior to the procedure, a History and Physical                            was performed, and patient medications and                            allergies were reviewed. The patient's tolerance of                            previous anesthesia was also reviewed. The risks                            and benefits of the procedure and the sedation                            options and risks were discussed with the patient.                            All questions were answered, and informed consent                            was obtained. Prior Anticoagulants: The patient has                            taken no anticoagulant or antiplatelet agents. ASA                            Grade Assessment: II - A patient with mild systemic  disease. After reviewing the risks and benefits,                            the patient was deemed in satisfactory condition to                            undergo the procedure.                           After obtaining informed consent, the colonoscope                            was passed under direct vision. Throughout the                            procedure, the  patient's blood pressure, pulse, and                            oxygen saturations were monitored continuously. The                            CF-HQ190L ([id number] Olympus colonoscope was                            introduced through the anus and advanced to the the                            cecum, identified by appendiceal orifice and                            ileocecal valve. The colonoscopy was performed                            without difficulty. The colon was somewhat                            torturous. The passage of scope was assisted by                            abdominal pressure. The patient tolerated the                            procedure well. The quality of the bowel                            preparation was adequate to identify polyps. Some                            retained stool. Nevertheless, aggressive suctioning                            and aspiration was performed. Overall the  examination was adequate. The ileocecal valve,                            appendiceal orifice, and rectum were photographed. Scope In: 9:58:18 AM Scope Out: 10:16:40 AM Scope Withdrawal Time: 0 hours 5 minutes 36 seconds  Total Procedure Duration: 0 hours 18 minutes 22 seconds  Findings:      A few medium-mouthed diverticula were found in the sigmoid colon and       rare in ascending colon.      Non-bleeding internal hemorrhoids were found during retroflexion. The       hemorrhoids were small and Grade I (internal hemorrhoids that do not       prolapse).      Retroflexion in the right colon was performed.      The exam was otherwise without abnormality on direct and retroflexion       views. Impression:               - Mild predominantly sigmoid diverticulosis.                           - Non-bleeding internal hemorrhoids.                           - The examination was otherwise normal on direct                            and retroflexion views.                            - No specimens collected. Moderate Sedation:      Not Applicable - Patient had care per Anesthesia. Recommendation:           - Patient has a contact number available for                            emergencies. The signs and symptoms of potential                            delayed complications were discussed with the                            patient. Return to normal activities tomorrow.                            Written discharge instructions were provided to the                            patient.                           - Resume previous diet.                           - Continue present medications.                           - Due to previous history of colonic polyps,  repeat                            colonoscopy in 5 years for screening purposes with                            2-day prep.                           - The findings and recommendations were discussed                            with the patient's family. Procedure Code(s):        --- Professional ---                           H9894, Colorectal cancer screening; colonoscopy on                            individual at high risk Diagnosis Code(s):        --- Professional ---                           Z86.010, Personal history of colonic polyps                           K64.0, First degree hemorrhoids                           K57.30, Diverticulosis of large intestine without                            perforation or abscess without bleeding CPT copyright 2022 [employer]. All rights reserved. The codes documented in this report are preliminary and upon coder review may  be revised to meet current compliance requirements. Lynnie Bring, MD 02/16/2024 10:26:33 AM This report has been signed electronically. Number of Addenda: 0

## 2024-02-16 NOTE — Anesthesia Preprocedure Evaluation (Addendum)
"                                    Anesthesia Evaluation  Patient identified by MRN, date of birth, ID band Patient awake    Reviewed: Allergy & Precautions, NPO status , Patient's Chart, lab work & pertinent test results, reviewed documented beta blocker date and time   Airway Mallampati: II  TM Distance: >3 FB Neck ROM: Full    Dental  (+) Dental Advisory Given, Edentulous Upper, Edentulous Lower   Pulmonary sleep apnea , COPD, former smoker   Pulmonary exam normal breath sounds clear to auscultation       Cardiovascular hypertension, Pt. on home beta blockers and Pt. on medications + CAD  Normal cardiovascular exam+ dysrhythmias Supra Ventricular Tachycardia  Rhythm:Regular Rate:Normal     Neuro/Psych  Headaches  Neuromuscular disease  negative psych ROS   GI/Hepatic Neg liver ROS,GERD  Medicated,,  Endo/Other  negative endocrine ROS    Renal/GU negative Renal ROS     Musculoskeletal  (+) Arthritis ,  Sjogren's syndrome   Abdominal   Peds  Hematology negative hematology ROS (+)   Anesthesia Other Findings Day of surgery medications reviewed with the patient.  Reproductive/Obstetrics                              Anesthesia Physical Anesthesia Plan  ASA: 2  Anesthesia Plan: MAC   Post-op Pain Management:    Induction: Intravenous  PONV Risk Score and Plan: 2 and TIVA and Treatment may vary due to age or medical condition  Airway Management Planned: Natural Airway and Simple Face Mask  Additional Equipment:   Intra-op Plan:   Post-operative Plan:   Informed Consent: I have reviewed the patients History and Physical, chart, labs and discussed the procedure including the risks, benefits and alternatives for the proposed anesthesia with the patient or authorized representative who has indicated his/her understanding and acceptance.       Plan Discussed with: CRNA  Anesthesia Plan Comments:           Anesthesia Quick Evaluation  "

## 2024-02-16 NOTE — Discharge Instructions (Signed)

## 2024-02-16 NOTE — Anesthesia Postprocedure Evaluation (Signed)
"   Anesthesia Post Note  Patient: Angel Avila  Procedure(s) Performed: EGD (ESOPHAGOGASTRODUODENOSCOPY) COLONOSCOPY BIOPSY, SKIN, SUBCUTANEOUS TISSUE, OR MUCOUS MEMBRANE     Patient location during evaluation: PACU Anesthesia Type: MAC Level of consciousness: awake and alert Pain management: pain level controlled Vital Signs Assessment: post-procedure vital signs reviewed and stable Respiratory status: spontaneous breathing, nonlabored ventilation and respiratory function stable Cardiovascular status: stable and blood pressure returned to baseline Postop Assessment: no apparent nausea or vomiting Anesthetic complications: no   No notable events documented.  Last Vitals:  Vitals:   02/16/24 1030 02/16/24 1040  BP: (!) 171/75 (!) 175/77  Pulse: 68 64  Resp: 18 20  Temp:    SpO2: 99% 99%    Last Pain:  Vitals:   02/16/24 1040  TempSrc:   PainSc: 0-No pain                 Garnette FORBES Skillern      "

## 2024-02-16 NOTE — Op Note (Signed)
 [facility] Patient Name: Angel Avila Procedure Date: 02/16/2024 MRN: [id number] Attending MD: Lynnie Bring , MD, [id number] Date of Birth: 1965/08/15 CSN: [id number] Age: 59 Admit Type: Outpatient Procedure:                Upper GI endoscopy Indications:              Heartburn Providers:                Lynnie Bring, MD, Ozell Pouch, Haskel Chris, Technician Referring MD:              Medicines:                Monitored Anesthesia Care Complications:            No immediate complications. Estimated Blood Loss:     Estimated blood loss: none. Procedure:                Pre-Anesthesia Assessment:                           - Prior to the procedure, a History and Physical                            was performed, and patient medications and                            allergies were reviewed. The patient's tolerance of                            previous anesthesia was also reviewed. The risks                            and benefits of the procedure and the sedation                            options and risks were discussed with the patient.                            All questions were answered, and informed consent                            was obtained. Prior Anticoagulants: The patient has                            taken no anticoagulant or antiplatelet agents. ASA                            Grade Assessment: II - A patient with mild systemic                            disease. After reviewing the risks and benefits,                            the  patient was deemed in satisfactory condition to                            undergo the procedure.                           After obtaining informed consent, the endoscope was                            passed under direct vision. Throughout the                            procedure, the patient's blood pressure, pulse, and                            oxygen saturations were monitored  continuously. The                            [id number]-[id number] ([id number] Olympus endoscope was introduced                            through the mouth, and advanced to the second part                            of duodenum. The upper GI endoscopy was                            accomplished without difficulty. The patient                            tolerated the procedure well. Scope In: Scope Out: Findings:      The examined esophagus was normal. Biopsies were obtained from the       proximal and distal esophagus with cold forceps for histology of to r/o       eosinophilic esophagitis.      The Z-line was regular and was found 35 cm from the incisors.      A small hiatal hernia (about 1 cm) was present. Best visualized on       Valsalva.      Localized mild inflammation characterized by congestion (edema) and       erythema was found in the gastric antrum. Biopsies were taken with a       cold forceps for histology.      The examined duodenum was normal. Impression:               - Small hiatal hernia.                           - Mild Gastritis. Biopsied. Moderate Sedation:      Not Applicable - Patient had care per Anesthesia. Recommendation:           - Patient has a contact number available for                            emergencies. The signs and symptoms of potential  delayed complications were discussed with the                            patient. Return to normal activities tomorrow.                            Written discharge instructions were provided to the                            patient.                           - Resume previous diet.                           - Continue present medications including omeprazole .                           - Await pathology results.                           - The findings and recommendations were discussed                            with the patient's family. Procedure Code(s):        --- Professional ---                            [phone], Esophagogastroduodenoscopy, flexible,                            transoral; with biopsy, single or multiple Diagnosis Code(s):        --- Professional ---                           K44.9, Diaphragmatic hernia without obstruction or                            gangrene                           K29.70, Gastritis, unspecified, without bleeding                           R12, Heartburn CPT copyright 2022 [employer]. All rights reserved. The codes documented in this report are preliminary and upon coder review may  be revised to meet current compliance requirements. Lynnie Bring, MD 02/16/2024 9:55:32 AM This report has been signed electronically. Number of Addenda: 0

## 2024-02-17 LAB — SURGICAL PATHOLOGY

## 2024-02-18 ENCOUNTER — Encounter (HOSPITAL_COMMUNITY): Payer: Self-pay | Admitting: Gastroenterology

## 2024-02-20 ENCOUNTER — Ambulatory Visit: Payer: Self-pay | Admitting: Gastroenterology

## 2024-02-23 ENCOUNTER — Telehealth: Payer: Self-pay

## 2024-02-23 NOTE — Telephone Encounter (Signed)
 Path Letter was sent to pt via mail.
